# Patient Record
Sex: Male | Born: 1935 | Race: White | Hispanic: No | Marital: Married | Smoking: Former smoker
Health system: Southern US, Community
[De-identification: ages and names within clinical notes are randomized; demographics above are authoritative.]

## PROBLEM LIST (undated history)

## (undated) DIAGNOSIS — E039 Hypothyroidism, unspecified: Secondary | ICD-10-CM

## (undated) DIAGNOSIS — E785 Hyperlipidemia, unspecified: Secondary | ICD-10-CM

## (undated) DIAGNOSIS — G4733 Obstructive sleep apnea (adult) (pediatric): Secondary | ICD-10-CM

## (undated) DIAGNOSIS — F0390 Unspecified dementia without behavioral disturbance: Secondary | ICD-10-CM

## (undated) DIAGNOSIS — I10 Essential (primary) hypertension: Secondary | ICD-10-CM

## (undated) DIAGNOSIS — G40909 Epilepsy, unspecified, not intractable, without status epilepticus: Secondary | ICD-10-CM

## (undated) DIAGNOSIS — I2510 Atherosclerotic heart disease of native coronary artery without angina pectoris: Secondary | ICD-10-CM

## (undated) HISTORY — PX: CHOLECYSTECTOMY: SHX55

## (undated) HISTORY — PX: OTHER SURGICAL HISTORY: SHX169

## (undated) HISTORY — DX: Essential (primary) hypertension: I10

## (undated) HISTORY — DX: Epilepsy, unspecified, not intractable, without status epilepticus: G40.909

## (undated) HISTORY — DX: Obstructive sleep apnea (adult) (pediatric): G47.33

## (undated) HISTORY — PX: POSTERIOR LAMINECTOMY / DECOMPRESSION CERVICAL SPINE: SUR739

## (undated) HISTORY — DX: Atherosclerotic heart disease of native coronary artery without angina pectoris: I25.10

## (undated) HISTORY — DX: Hyperlipidemia, unspecified: E78.5

## (undated) HISTORY — DX: Unspecified dementia, unspecified severity, without behavioral disturbance, psychotic disturbance, mood disturbance, and anxiety: F03.90

## (undated) HISTORY — DX: Hypothyroidism, unspecified: E03.9

---

## 2006-03-27 ENCOUNTER — Ambulatory Visit: Payer: Self-pay | Admitting: Family Medicine

## 2006-03-28 ENCOUNTER — Ambulatory Visit: Payer: Self-pay | Admitting: Family Medicine

## 2006-05-08 ENCOUNTER — Ambulatory Visit: Payer: Self-pay | Admitting: Family Medicine

## 2006-05-08 LAB — CONVERTED CEMR LAB

## 2006-05-09 ENCOUNTER — Encounter: Payer: Self-pay | Admitting: Family Medicine

## 2006-05-09 LAB — CONVERTED CEMR LAB: Creatinine, Ser: 1.1 mg/dL

## 2006-05-16 DIAGNOSIS — E785 Hyperlipidemia, unspecified: Secondary | ICD-10-CM | POA: Insufficient documentation

## 2006-05-31 ENCOUNTER — Ambulatory Visit: Payer: Self-pay | Admitting: Family Medicine

## 2006-05-31 ENCOUNTER — Encounter: Payer: Self-pay | Admitting: Family Medicine

## 2006-05-31 DIAGNOSIS — G4733 Obstructive sleep apnea (adult) (pediatric): Secondary | ICD-10-CM

## 2006-06-01 ENCOUNTER — Encounter: Payer: Self-pay | Admitting: Family Medicine

## 2006-06-01 LAB — CONVERTED CEMR LAB
HDL: 34 mg/dL — ABNORMAL LOW (ref >39)
LDL Cholesterol: 81 mg/dL (ref 0–99)
Total CHOL/HDL Ratio: 4.5
Triglycerides: 191 mg/dL — ABNORMAL HIGH (ref <150)

## 2006-07-10 ENCOUNTER — Ambulatory Visit: Payer: Self-pay | Admitting: Family Medicine

## 2006-07-11 ENCOUNTER — Telehealth: Payer: Self-pay | Admitting: Family Medicine

## 2006-07-24 ENCOUNTER — Ambulatory Visit: Payer: Self-pay | Admitting: Family Medicine

## 2006-07-25 ENCOUNTER — Encounter: Payer: Self-pay | Admitting: Family Medicine

## 2006-07-25 ENCOUNTER — Telehealth: Payer: Self-pay | Admitting: Family Medicine

## 2006-07-25 LAB — CONVERTED CEMR LAB
Albumin: 4.2 g/dL (ref 3.5–5.2)
BUN: 29 mg/dL — ABNORMAL HIGH (ref 6–23)
CO2: 27 meq/L (ref 19–32)
Glucose, Bld: 118 mg/dL — ABNORMAL HIGH (ref 70–99)
Potassium: 4.3 meq/L (ref 3.5–5.3)
Sodium: 137 meq/L (ref 135–145)
Total CK: 140 units/L (ref 7–232)
Total Protein: 7.3 g/dL (ref 6.0–8.3)

## 2006-07-26 ENCOUNTER — Telehealth: Payer: Self-pay | Admitting: Family Medicine

## 2006-08-04 ENCOUNTER — Telehealth: Payer: Self-pay | Admitting: Family Medicine

## 2006-08-10 ENCOUNTER — Encounter: Payer: Self-pay | Admitting: Family Medicine

## 2006-10-04 ENCOUNTER — Ambulatory Visit: Payer: Self-pay | Admitting: Family Medicine

## 2006-10-04 LAB — CONVERTED CEMR LAB: Microalbumin U total vol: 200 mg/L

## 2006-10-10 ENCOUNTER — Telehealth: Payer: Self-pay | Admitting: Family Medicine

## 2006-10-10 ENCOUNTER — Encounter: Payer: Self-pay | Admitting: Family Medicine

## 2006-10-24 ENCOUNTER — Ambulatory Visit: Payer: Self-pay | Admitting: Family Medicine

## 2006-11-09 ENCOUNTER — Encounter: Payer: Self-pay | Admitting: Family Medicine

## 2006-11-13 ENCOUNTER — Telehealth: Payer: Self-pay | Admitting: Family Medicine

## 2006-11-14 ENCOUNTER — Telehealth: Payer: Self-pay | Admitting: Family Medicine

## 2006-11-14 DIAGNOSIS — R9431 Abnormal electrocardiogram [ECG] [EKG]: Secondary | ICD-10-CM

## 2006-11-17 ENCOUNTER — Ambulatory Visit: Payer: Self-pay | Admitting: Cardiology

## 2006-11-28 ENCOUNTER — Ambulatory Visit: Payer: Self-pay

## 2006-11-28 ENCOUNTER — Encounter: Payer: Self-pay | Admitting: Cardiology

## 2006-11-28 ENCOUNTER — Encounter: Payer: Self-pay | Admitting: Family Medicine

## 2006-12-06 ENCOUNTER — Ambulatory Visit: Payer: Self-pay | Admitting: Cardiology

## 2007-01-30 ENCOUNTER — Encounter: Payer: Self-pay | Admitting: Family Medicine

## 2007-02-02 ENCOUNTER — Encounter: Payer: Self-pay | Admitting: Family Medicine

## 2007-02-02 LAB — CONVERTED CEMR LAB
HDL: 33 mg/dL — ABNORMAL LOW (ref >39)
LDL Cholesterol: 84 mg/dL (ref 0–99)
Total CHOL/HDL Ratio: 4.8
VLDL: 41 mg/dL — ABNORMAL HIGH (ref 0–40)

## 2007-02-28 ENCOUNTER — Ambulatory Visit: Payer: Self-pay | Admitting: Cardiology

## 2007-02-28 ENCOUNTER — Encounter: Admission: RE | Admit: 2007-02-28 | Discharge: 2007-02-28 | Payer: Self-pay | Admitting: Cardiology

## 2007-03-06 ENCOUNTER — Ambulatory Visit: Payer: Self-pay | Admitting: Cardiovascular Disease

## 2007-03-06 ENCOUNTER — Inpatient Hospital Stay (HOSPITAL_BASED_OUTPATIENT_CLINIC_OR_DEPARTMENT_OTHER): Admission: RE | Admit: 2007-03-06 | Discharge: 2007-03-06 | Payer: Self-pay | Admitting: Cardiovascular Disease

## 2007-03-06 ENCOUNTER — Ambulatory Visit (HOSPITAL_COMMUNITY): Admission: RE | Admit: 2007-03-06 | Discharge: 2007-03-07 | Payer: Self-pay | Admitting: Cardiology

## 2007-03-13 ENCOUNTER — Ambulatory Visit: Payer: Self-pay | Admitting: Cardiology

## 2007-03-13 ENCOUNTER — Ambulatory Visit: Payer: Self-pay

## 2007-03-13 ENCOUNTER — Encounter: Payer: Self-pay | Admitting: Family Medicine

## 2007-03-14 ENCOUNTER — Ambulatory Visit: Payer: Self-pay | Admitting: Cardiovascular Disease

## 2007-03-14 ENCOUNTER — Ambulatory Visit: Payer: Self-pay | Admitting: Vascular Surgery

## 2007-03-14 ENCOUNTER — Ambulatory Visit (HOSPITAL_COMMUNITY): Admission: RE | Admit: 2007-03-14 | Discharge: 2007-03-14 | Payer: Self-pay | Admitting: Cardiovascular Disease

## 2007-03-16 ENCOUNTER — Ambulatory Visit: Payer: Self-pay | Admitting: Family Medicine

## 2007-03-16 LAB — CONVERTED CEMR LAB: Hgb A1c MFr Bld: 6.4 %

## 2007-03-20 ENCOUNTER — Encounter: Payer: Self-pay | Admitting: Family Medicine

## 2007-03-21 ENCOUNTER — Ambulatory Visit: Payer: Self-pay | Admitting: Cardiology

## 2007-04-12 ENCOUNTER — Telehealth: Payer: Self-pay | Admitting: Family Medicine

## 2007-05-15 ENCOUNTER — Ambulatory Visit: Payer: Self-pay | Admitting: Family Medicine

## 2007-05-15 DIAGNOSIS — F528 Other sexual dysfunction not due to a substance or known physiological condition: Secondary | ICD-10-CM

## 2007-05-15 DIAGNOSIS — F329 Major depressive disorder, single episode, unspecified: Secondary | ICD-10-CM

## 2007-05-16 ENCOUNTER — Encounter: Payer: Self-pay | Admitting: Family Medicine

## 2007-06-20 ENCOUNTER — Telehealth: Payer: Self-pay | Admitting: Family Medicine

## 2007-07-20 ENCOUNTER — Ambulatory Visit (HOSPITAL_COMMUNITY): Payer: Self-pay | Admitting: Psychiatry

## 2007-07-26 ENCOUNTER — Telehealth: Payer: Self-pay | Admitting: Family Medicine

## 2007-08-20 ENCOUNTER — Ambulatory Visit (HOSPITAL_COMMUNITY): Payer: Self-pay | Admitting: Psychiatry

## 2007-09-10 ENCOUNTER — Ambulatory Visit: Payer: Self-pay | Admitting: Family Medicine

## 2007-09-11 ENCOUNTER — Encounter: Payer: Self-pay | Admitting: Family Medicine

## 2007-09-12 ENCOUNTER — Telehealth (INDEPENDENT_AMBULATORY_CARE_PROVIDER_SITE_OTHER): Payer: Self-pay | Admitting: *Deleted

## 2007-09-12 ENCOUNTER — Encounter: Payer: Self-pay | Admitting: Family Medicine

## 2007-09-12 LAB — CONVERTED CEMR LAB
ALT: 18 units/L (ref 0–53)
Albumin: 4.3 g/dL (ref 3.5–5.2)
CO2: 22 meq/L (ref 19–32)
Cholesterol: 125 mg/dL (ref 0–200)
Glucose, Bld: 131 mg/dL — ABNORMAL HIGH (ref 70–99)
Hemoglobin: 14.5 g/dL (ref 13.0–17.0)
Hgb A1c MFr Bld: 6.3 % — ABNORMAL HIGH (ref 4.6–6.1)
LDL Cholesterol: 61 mg/dL (ref 0–99)
Platelets: 206 10*3/uL (ref 150–400)
Potassium: 4.8 meq/L (ref 3.5–5.3)
Sodium: 143 meq/L (ref 135–145)
TSH: 5.026 microintl units/mL (ref 0.350–5.50)
Total Protein: 7.2 g/dL (ref 6.0–8.3)
Triglycerides: 154 mg/dL — ABNORMAL HIGH (ref <150)
VLDL: 31 mg/dL (ref 0–40)

## 2007-09-13 LAB — CONVERTED CEMR LAB: Free T4: 1.4 ng/dL (ref 0.89–1.80)

## 2007-09-18 ENCOUNTER — Telehealth: Payer: Self-pay | Admitting: Family Medicine

## 2007-09-19 ENCOUNTER — Encounter: Payer: Self-pay | Admitting: Family Medicine

## 2007-09-19 ENCOUNTER — Ambulatory Visit: Payer: Self-pay | Admitting: Cardiology

## 2007-09-20 ENCOUNTER — Encounter: Payer: Self-pay | Admitting: Family Medicine

## 2007-10-01 ENCOUNTER — Ambulatory Visit (HOSPITAL_COMMUNITY): Payer: Self-pay | Admitting: Psychiatry

## 2007-11-24 IMAGING — CR DG CHEST 2V
2 series · 2 of 2 positions shown · non-contrast
Comparison: None.

CLINICAL DATA: Ex-smoker with abnormal stress test. 
 CHEST - 2 VIEW:

[view not recorded (1 of 2)]
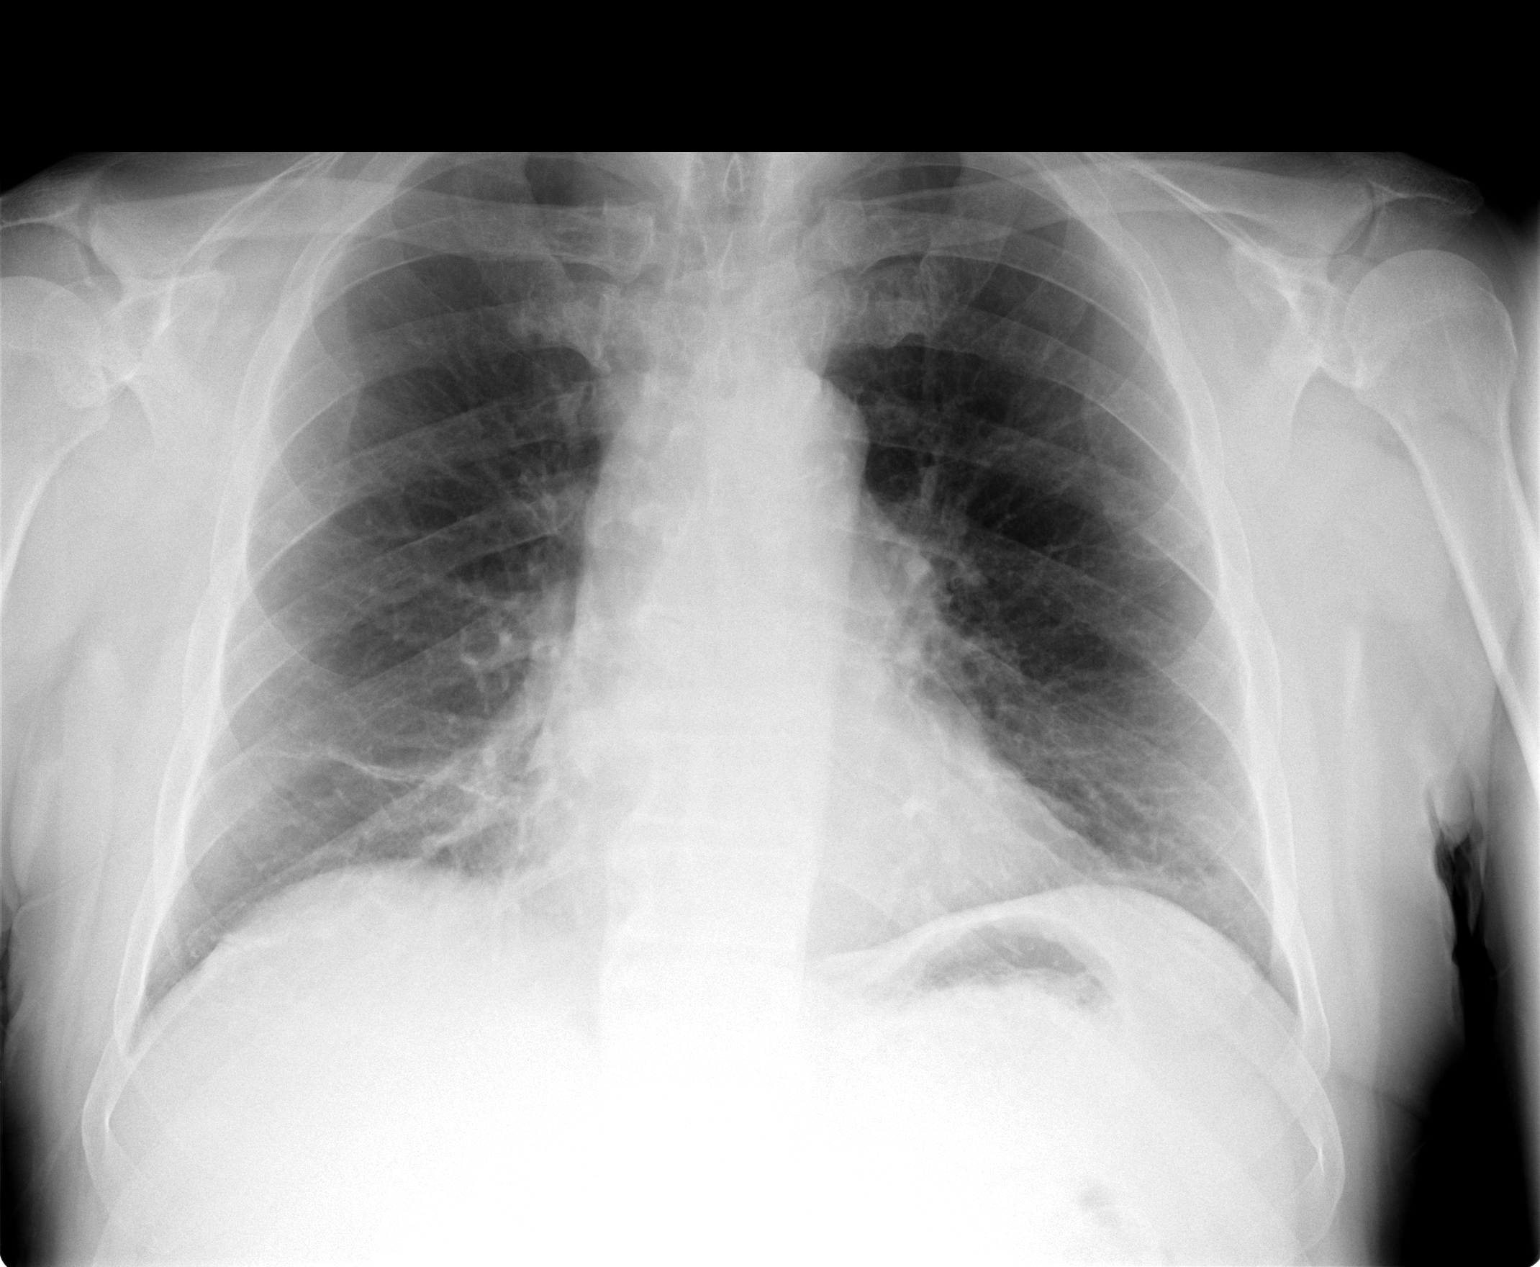

[view not recorded (2 of 2)]
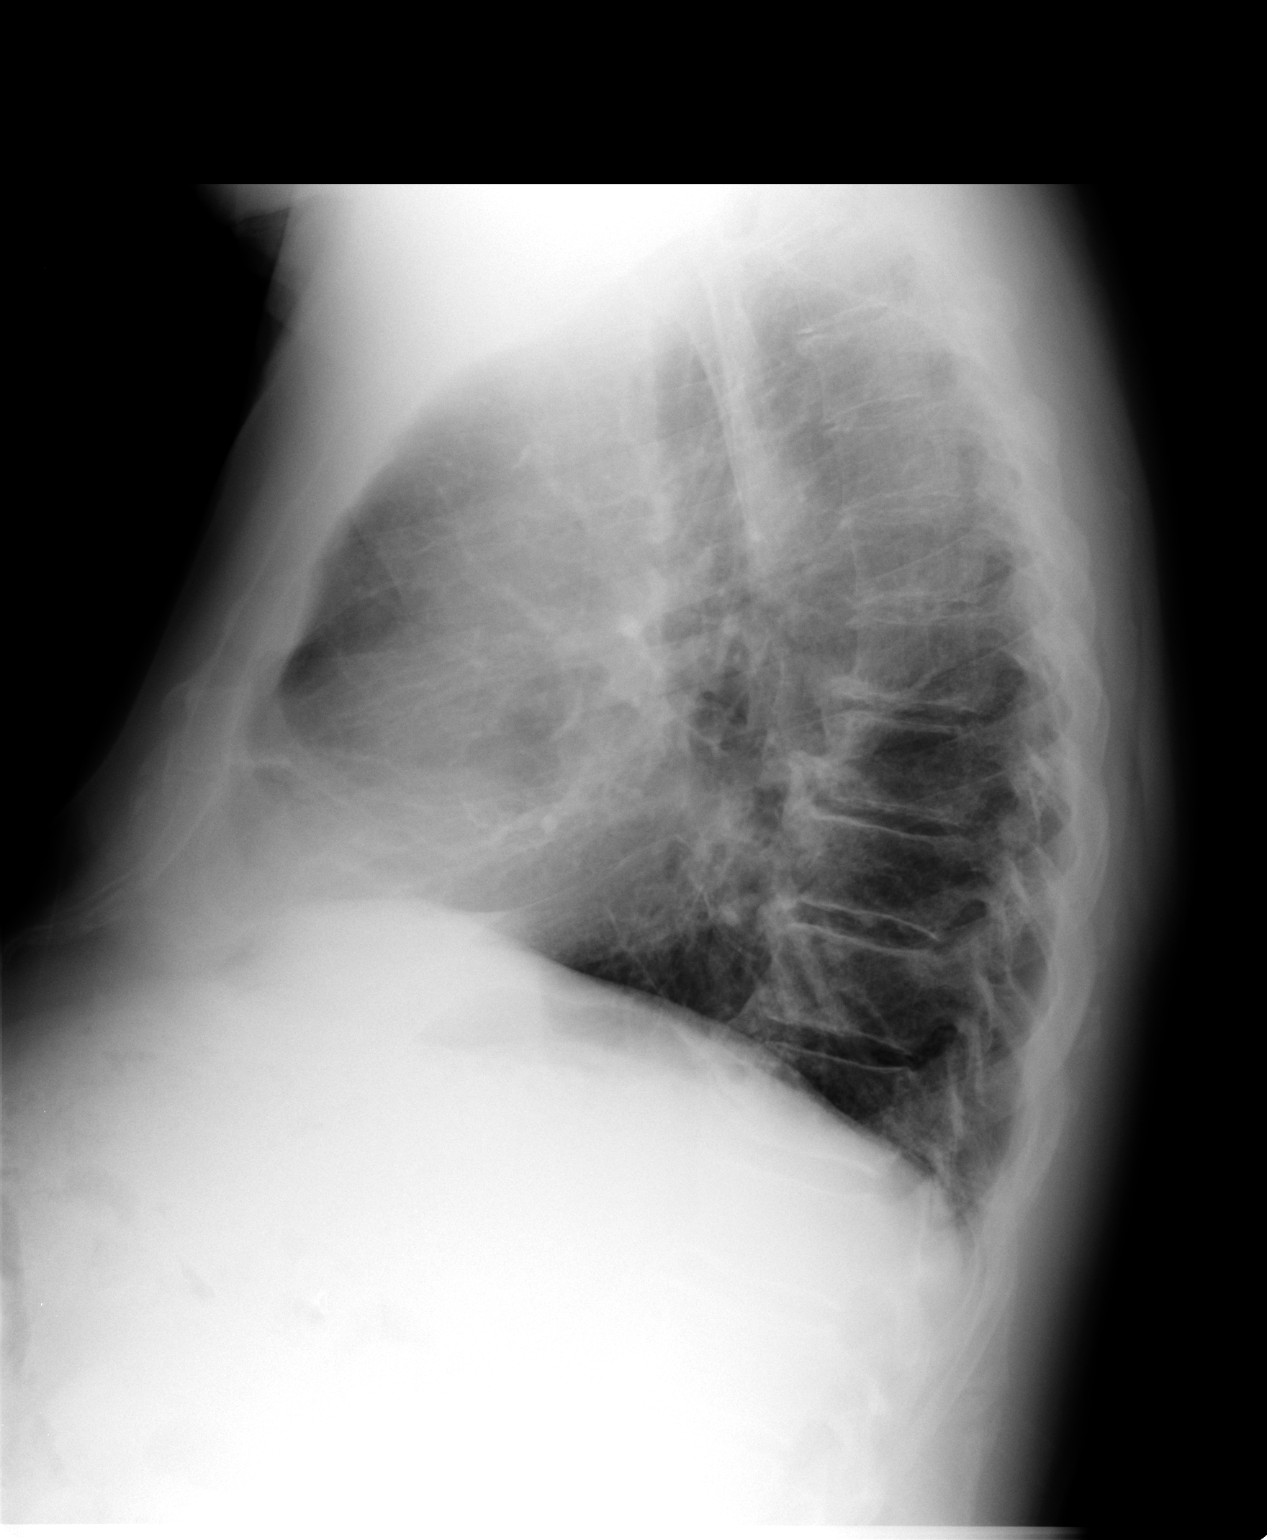

[2 of 2 positions shown; findings below may reference images not displayed]

FINDINGS: Trachea is midline.  Heart size at the upper limits of normal.  Bibasilar scarring or atelectasis.  No pleural fluid.
IMPRESSION: No acute findings.

## 2007-11-26 ENCOUNTER — Ambulatory Visit (HOSPITAL_COMMUNITY): Payer: Self-pay | Admitting: Psychiatry

## 2008-01-07 ENCOUNTER — Ambulatory Visit (HOSPITAL_COMMUNITY): Payer: Self-pay | Admitting: Psychiatry

## 2008-01-31 ENCOUNTER — Ambulatory Visit: Payer: Self-pay | Admitting: Family Medicine

## 2008-02-25 ENCOUNTER — Ambulatory Visit: Payer: Self-pay | Admitting: Family Medicine

## 2008-02-25 DIAGNOSIS — R42 Dizziness and giddiness: Secondary | ICD-10-CM

## 2008-02-26 ENCOUNTER — Encounter: Payer: Self-pay | Admitting: Family Medicine

## 2008-02-28 ENCOUNTER — Encounter: Payer: Self-pay | Admitting: Family Medicine

## 2008-02-28 ENCOUNTER — Telehealth (INDEPENDENT_AMBULATORY_CARE_PROVIDER_SITE_OTHER): Payer: Self-pay | Admitting: *Deleted

## 2008-02-28 LAB — CONVERTED CEMR LAB
ALT: 25 units/L (ref 0–53)
AST: 18 units/L (ref 0–37)
Albumin: 4 g/dL (ref 3.5–5.2)
Alkaline Phosphatase: 40 units/L (ref 39–117)
Basophils Absolute: 0.1 10*3/uL (ref 0.0–0.1)
Basophils Relative: 1 % (ref 0–1)
Calcium: 9 mg/dL (ref 8.4–10.5)
Chloride: 107 meq/L (ref 96–112)
Eosinophils Absolute: 0.3 10*3/uL (ref 0.0–0.7)
HDL: 39 mg/dL — ABNORMAL LOW (ref >39)
LDL Cholesterol: 80 mg/dL (ref 0–99)
MCHC: 32.5 g/dL (ref 30.0–36.0)
MCV: 90.4 fL (ref 78.0–100.0)
Monocytes Relative: 10 % (ref 3–12)
Neutro Abs: 4.7 10*3/uL (ref 1.7–7.7)
Neutrophils Relative %: 61 % (ref 43–77)
PSA: 0.32 ng/mL (ref 0.10–4.00)
Platelets: 184 10*3/uL (ref 150–400)
Potassium: 4.3 meq/L (ref 3.5–5.3)
RBC: 4.39 M/uL (ref 4.22–5.81)
Sodium: 139 meq/L (ref 135–145)
TSH: 6.301 microintl units/mL — ABNORMAL HIGH (ref 0.350–4.50)
Total Protein: 6.7 g/dL (ref 6.0–8.3)

## 2008-02-29 LAB — CONVERTED CEMR LAB
Free T4: 1.05 ng/dL (ref 0.89–1.80)
T3, Free: 2.9 pg/mL (ref 2.3–4.2)

## 2008-03-03 ENCOUNTER — Encounter: Payer: Self-pay | Admitting: Family Medicine

## 2008-03-03 ENCOUNTER — Encounter: Admission: RE | Admit: 2008-03-03 | Discharge: 2008-03-18 | Payer: Self-pay | Admitting: Family Medicine

## 2008-03-07 ENCOUNTER — Ambulatory Visit (HOSPITAL_COMMUNITY): Payer: Self-pay | Admitting: Psychiatry

## 2008-03-14 ENCOUNTER — Ambulatory Visit (HOSPITAL_COMMUNITY): Payer: Self-pay | Admitting: Psychiatry

## 2008-03-18 ENCOUNTER — Encounter: Payer: Self-pay | Admitting: Family Medicine

## 2008-03-27 ENCOUNTER — Encounter: Payer: Self-pay | Admitting: Family Medicine

## 2008-04-04 ENCOUNTER — Ambulatory Visit: Payer: Self-pay | Admitting: Family Medicine

## 2008-04-04 DIAGNOSIS — I498 Other specified cardiac arrhythmias: Secondary | ICD-10-CM

## 2008-04-21 ENCOUNTER — Ambulatory Visit: Payer: Self-pay | Admitting: Family Medicine

## 2008-04-22 ENCOUNTER — Telehealth (INDEPENDENT_AMBULATORY_CARE_PROVIDER_SITE_OTHER): Payer: Self-pay | Admitting: *Deleted

## 2008-04-22 ENCOUNTER — Encounter: Payer: Self-pay | Admitting: Family Medicine

## 2008-04-23 LAB — CONVERTED CEMR LAB
ALT: 20 units/L (ref 0–53)
Albumin: 4 g/dL (ref 3.5–5.2)
Alkaline Phosphatase: 53 units/L (ref 39–117)
Basophils Absolute: 0 10*3/uL (ref 0.0–0.1)
CO2: 20 meq/L (ref 19–32)
Eosinophils Relative: 1 % (ref 0–5)
HCT: 39 % (ref 39.0–52.0)
Lymphocytes Relative: 16 % (ref 12–46)
Neutrophils Relative %: 72 % (ref 43–77)
Platelets: 229 10*3/uL (ref 150–400)
Potassium: 4.3 meq/L (ref 3.5–5.3)
RDW: 14 % (ref 11.5–15.5)
Sodium: 140 meq/L (ref 135–145)
TSH: 4.812 microintl units/mL — ABNORMAL HIGH (ref 0.350–4.50)
Total Bilirubin: 0.4 mg/dL (ref 0.3–1.2)
Total Protein: 7.1 g/dL (ref 6.0–8.3)
WBC: 12 10*3/uL — ABNORMAL HIGH (ref 4.0–10.5)

## 2008-04-25 ENCOUNTER — Encounter: Admission: RE | Admit: 2008-04-25 | Discharge: 2008-04-25 | Payer: Self-pay | Admitting: Family Medicine

## 2008-05-09 ENCOUNTER — Ambulatory Visit (HOSPITAL_COMMUNITY): Payer: Self-pay | Admitting: Psychiatry

## 2008-05-29 ENCOUNTER — Encounter: Payer: Self-pay | Admitting: Family Medicine

## 2008-06-13 ENCOUNTER — Encounter: Payer: Self-pay | Admitting: Family Medicine

## 2008-06-14 ENCOUNTER — Encounter: Payer: Self-pay | Admitting: Family Medicine

## 2008-06-19 ENCOUNTER — Ambulatory Visit: Payer: Self-pay | Admitting: Family Medicine

## 2008-06-19 DIAGNOSIS — G309 Alzheimer's disease, unspecified: Secondary | ICD-10-CM

## 2008-06-19 DIAGNOSIS — F0280 Dementia in other diseases classified elsewhere without behavioral disturbance: Secondary | ICD-10-CM | POA: Insufficient documentation

## 2008-06-20 ENCOUNTER — Telehealth: Payer: Self-pay | Admitting: Family Medicine

## 2008-06-26 ENCOUNTER — Encounter: Payer: Self-pay | Admitting: Family Medicine

## 2008-07-11 ENCOUNTER — Telehealth: Payer: Self-pay | Admitting: Family Medicine

## 2008-07-18 ENCOUNTER — Ambulatory Visit: Payer: Self-pay | Admitting: Family Medicine

## 2008-07-18 DIAGNOSIS — M79609 Pain in unspecified limb: Secondary | ICD-10-CM

## 2008-07-19 ENCOUNTER — Telehealth: Payer: Self-pay | Admitting: Family Medicine

## 2008-07-19 ENCOUNTER — Encounter: Payer: Self-pay | Admitting: Family Medicine

## 2008-07-19 ENCOUNTER — Ambulatory Visit (HOSPITAL_COMMUNITY): Admission: RE | Admit: 2008-07-19 | Discharge: 2008-07-19 | Payer: Self-pay | Admitting: Family Medicine

## 2008-07-19 ENCOUNTER — Ambulatory Visit: Payer: Self-pay | Admitting: Vascular Surgery

## 2008-07-22 ENCOUNTER — Telehealth: Payer: Self-pay | Admitting: Family Medicine

## 2008-07-24 ENCOUNTER — Ambulatory Visit: Payer: Self-pay | Admitting: Family Medicine

## 2008-07-24 DIAGNOSIS — M7120 Synovial cyst of popliteal space [Baker], unspecified knee: Secondary | ICD-10-CM | POA: Insufficient documentation

## 2008-08-11 ENCOUNTER — Ambulatory Visit (HOSPITAL_COMMUNITY): Payer: Self-pay | Admitting: Psychiatry

## 2008-08-15 ENCOUNTER — Encounter: Payer: Self-pay | Admitting: Family Medicine

## 2008-10-01 ENCOUNTER — Ambulatory Visit: Payer: Self-pay | Admitting: Family Medicine

## 2008-10-01 DIAGNOSIS — R569 Unspecified convulsions: Secondary | ICD-10-CM

## 2008-10-02 LAB — CONVERTED CEMR LAB
Albumin: 4.1 g/dL (ref 3.5–5.2)
Bilirubin, Direct: <0.1 mg/dL (ref 0.0–0.3)
CO2: 22 meq/L (ref 19–32)
Calcium: 9.2 mg/dL (ref 8.4–10.5)
Glucose, Bld: 110 mg/dL — ABNORMAL HIGH (ref 70–99)
Potassium: 4.5 meq/L (ref 3.5–5.3)
Sodium: 136 meq/L (ref 135–145)
Total Protein: 7.1 g/dL (ref 6.0–8.3)

## 2008-10-06 ENCOUNTER — Encounter: Admission: RE | Admit: 2008-10-06 | Discharge: 2008-10-06 | Payer: Self-pay | Admitting: Family Medicine

## 2008-11-10 ENCOUNTER — Encounter: Payer: Self-pay | Admitting: Family Medicine

## 2008-11-11 ENCOUNTER — Ambulatory Visit (HOSPITAL_COMMUNITY): Payer: Self-pay | Admitting: Psychiatry

## 2008-11-19 ENCOUNTER — Ambulatory Visit: Payer: Self-pay | Admitting: Occupational Medicine

## 2008-11-19 DIAGNOSIS — M25569 Pain in unspecified knee: Secondary | ICD-10-CM | POA: Insufficient documentation

## 2008-11-20 ENCOUNTER — Encounter: Payer: Self-pay | Admitting: Family Medicine

## 2008-12-09 ENCOUNTER — Encounter: Payer: Self-pay | Admitting: Family Medicine

## 2008-12-11 ENCOUNTER — Telehealth (INDEPENDENT_AMBULATORY_CARE_PROVIDER_SITE_OTHER): Payer: Self-pay | Admitting: Internal Medicine

## 2008-12-11 ENCOUNTER — Ambulatory Visit: Payer: Self-pay | Admitting: Family Medicine

## 2008-12-11 LAB — CONVERTED CEMR LAB
Nitrite: NEGATIVE
Urobilinogen, UA: 1

## 2008-12-12 ENCOUNTER — Encounter: Payer: Self-pay | Admitting: Family Medicine

## 2008-12-15 ENCOUNTER — Encounter: Payer: Self-pay | Admitting: Family Medicine

## 2008-12-16 ENCOUNTER — Emergency Department (HOSPITAL_BASED_OUTPATIENT_CLINIC_OR_DEPARTMENT_OTHER): Admission: EM | Admit: 2008-12-16 | Discharge: 2008-12-16 | Payer: Self-pay | Admitting: Emergency Medicine

## 2008-12-16 ENCOUNTER — Ambulatory Visit: Payer: Self-pay | Admitting: Family Medicine

## 2008-12-16 ENCOUNTER — Telehealth: Payer: Self-pay | Admitting: Family Medicine

## 2008-12-16 DIAGNOSIS — E871 Hypo-osmolality and hyponatremia: Secondary | ICD-10-CM | POA: Insufficient documentation

## 2008-12-17 ENCOUNTER — Encounter: Payer: Self-pay | Admitting: Family Medicine

## 2008-12-17 LAB — CONVERTED CEMR LAB
BUN: 20 mg/dL (ref 6–23)
Basophils Relative: 3 % — ABNORMAL HIGH (ref 0–1)
Eosinophils Absolute: 0.2 10*3/uL (ref 0.0–0.7)
Glucose, Bld: 105 mg/dL — ABNORMAL HIGH (ref 70–99)
MCHC: 35.3 g/dL (ref 30.0–36.0)
MCV: 80.5 fL (ref 78.0–100.0)
Monocytes Relative: 21 % — ABNORMAL HIGH (ref 3–12)
Neutrophils Relative %: 60 % (ref 43–77)
Platelets: 250 10*3/uL (ref 150–400)
Potassium: 5.1 meq/L (ref 3.5–5.3)
RBC: 3.38 M/uL — ABNORMAL LOW (ref 4.22–5.81)

## 2008-12-18 ENCOUNTER — Encounter: Payer: Self-pay | Admitting: Family Medicine

## 2008-12-18 LAB — CONVERTED CEMR LAB
Folate: >20 ng/mL
Free T4: 0.97 ng/dL (ref 0.80–1.80)
Iron: 92 ug/dL (ref 42–165)
T3, Free: 2.3 pg/mL (ref 2.3–4.2)
Vitamin B-12: 893 pg/mL (ref 211–911)

## 2008-12-19 ENCOUNTER — Encounter: Payer: Self-pay | Admitting: Family Medicine

## 2008-12-19 LAB — CONVERTED CEMR LAB
CO2: 18 meq/L — ABNORMAL LOW (ref 19–32)
Calcium: 8.5 mg/dL (ref 8.4–10.5)
Sodium: 134 meq/L — ABNORMAL LOW (ref 135–145)

## 2008-12-22 ENCOUNTER — Encounter: Payer: Self-pay | Admitting: Family Medicine

## 2008-12-26 ENCOUNTER — Encounter: Payer: Self-pay | Admitting: Family Medicine

## 2008-12-30 ENCOUNTER — Encounter: Payer: Self-pay | Admitting: Family Medicine

## 2009-01-01 ENCOUNTER — Encounter: Payer: Self-pay | Admitting: Family Medicine

## 2009-01-09 ENCOUNTER — Ambulatory Visit: Payer: Self-pay | Admitting: Family Medicine

## 2009-01-09 DIAGNOSIS — M6281 Muscle weakness (generalized): Secondary | ICD-10-CM | POA: Insufficient documentation

## 2009-01-10 ENCOUNTER — Ambulatory Visit: Payer: Self-pay | Admitting: Family Medicine

## 2009-01-10 DIAGNOSIS — R319 Hematuria, unspecified: Secondary | ICD-10-CM

## 2009-01-10 DIAGNOSIS — E119 Type 2 diabetes mellitus without complications: Secondary | ICD-10-CM

## 2009-01-10 LAB — CONVERTED CEMR LAB
Bilirubin Urine: NEGATIVE
Glucose, Urine, Semiquant: NEGATIVE
Nitrite: POSITIVE

## 2009-01-11 ENCOUNTER — Telehealth: Payer: Self-pay | Admitting: Family Medicine

## 2009-01-12 LAB — CONVERTED CEMR LAB
Sex Hormone Binding: 34 nmol/L (ref 13–71)
Testosterone Free: 24.2 pg/mL — ABNORMAL LOW (ref 47.0–244.0)

## 2009-01-15 ENCOUNTER — Ambulatory Visit: Payer: Self-pay | Admitting: Family Medicine

## 2009-01-15 DIAGNOSIS — E291 Testicular hypofunction: Secondary | ICD-10-CM

## 2009-01-23 ENCOUNTER — Encounter: Payer: Self-pay | Admitting: Family Medicine

## 2009-01-27 ENCOUNTER — Telehealth: Payer: Self-pay | Admitting: Family Medicine

## 2009-01-28 ENCOUNTER — Encounter (INDEPENDENT_AMBULATORY_CARE_PROVIDER_SITE_OTHER): Payer: Self-pay | Admitting: *Deleted

## 2009-01-29 ENCOUNTER — Ambulatory Visit: Payer: Self-pay | Admitting: Family Medicine

## 2009-02-10 ENCOUNTER — Ambulatory Visit (HOSPITAL_COMMUNITY): Payer: Self-pay | Admitting: Psychiatry

## 2009-02-12 ENCOUNTER — Ambulatory Visit: Payer: Self-pay | Admitting: Family Medicine

## 2009-02-17 ENCOUNTER — Encounter: Payer: Self-pay | Admitting: Family Medicine

## 2009-02-26 ENCOUNTER — Ambulatory Visit: Payer: Self-pay | Admitting: Family Medicine

## 2009-02-27 LAB — CONVERTED CEMR LAB
ALT: 19 units/L (ref 0–53)
AST: 17 units/L (ref 0–37)
Bilirubin, Direct: 0.1 mg/dL (ref 0.0–0.3)
Indirect Bilirubin: 0.4 mg/dL (ref 0.0–0.9)

## 2009-03-01 ENCOUNTER — Ambulatory Visit: Payer: Self-pay | Admitting: Family Medicine

## 2009-03-01 DIAGNOSIS — I10 Essential (primary) hypertension: Secondary | ICD-10-CM | POA: Insufficient documentation

## 2009-03-01 DIAGNOSIS — E039 Hypothyroidism, unspecified: Secondary | ICD-10-CM

## 2009-03-01 LAB — CONVERTED CEMR LAB
BUN: 17 mg/dL (ref 6–23)
Chloride: 102 meq/L (ref 96–112)
Creatinine, Ser: 0.97 mg/dL (ref 0.40–1.50)
Eosinophils Absolute: 0 10*3/uL (ref 0.0–0.7)
Eosinophils Relative: 0 % (ref 0–5)
Glucose, Bld: 131 mg/dL — ABNORMAL HIGH (ref 70–99)
HCT: 44.3 % (ref 39.0–52.0)
Hemoglobin: 14.4 g/dL (ref 13.0–17.0)
Lymphs Abs: 0.9 10*3/uL (ref 0.7–4.0)
MCV: 85.9 fL (ref 78.0–100.0)
Monocytes Absolute: 1.5 10*3/uL — ABNORMAL HIGH (ref 0.1–1.0)
Platelets: 297 10*3/uL (ref 150–400)
RDW: 14.2 % (ref 11.5–15.5)
WBC: 16.6 10*3/uL — ABNORMAL HIGH (ref 4.0–10.5)

## 2009-03-07 DIAGNOSIS — I2510 Atherosclerotic heart disease of native coronary artery without angina pectoris: Secondary | ICD-10-CM | POA: Insufficient documentation

## 2009-03-18 ENCOUNTER — Ambulatory Visit: Payer: Self-pay | Admitting: Cardiology

## 2009-03-18 ENCOUNTER — Encounter: Payer: Self-pay | Admitting: Cardiology

## 2009-03-19 ENCOUNTER — Encounter: Payer: Self-pay | Admitting: Cardiology

## 2009-04-06 ENCOUNTER — Ambulatory Visit: Payer: Self-pay | Admitting: Family Medicine

## 2009-04-07 ENCOUNTER — Encounter: Payer: Self-pay | Admitting: Family Medicine

## 2009-04-07 LAB — CONVERTED CEMR LAB
ALT: 14 units/L (ref 0–53)
AST: 15 units/L (ref 0–37)
Alkaline Phosphatase: 55 units/L (ref 39–117)
BUN: 18 mg/dL (ref 6–23)
Basophils Absolute: 0.1 10*3/uL (ref 0.0–0.1)
Basophils Relative: 1 % (ref 0–1)
Calcium: 9.1 mg/dL (ref 8.4–10.5)
Creatinine, Ser: 1.03 mg/dL (ref 0.40–1.50)
Eosinophils Absolute: 0.1 10*3/uL (ref 0.0–0.7)
MCHC: 31.6 g/dL (ref 30.0–36.0)
MCV: 84.5 fL (ref 78.0–100.0)
Monocytes Relative: 9 % (ref 3–12)
Neutro Abs: 8.7 10*3/uL — ABNORMAL HIGH (ref 1.7–7.7)
Neutrophils Relative %: 77 % (ref 43–77)
Platelets: 230 10*3/uL (ref 150–400)
RBC: 5.1 M/uL (ref 4.22–5.81)
RDW: 15.4 % (ref 11.5–15.5)
Sed Rate: 12 mm/hr (ref 0–16)
Total Bilirubin: 0.4 mg/dL (ref 0.3–1.2)

## 2009-04-09 ENCOUNTER — Encounter: Payer: Self-pay | Admitting: Family Medicine

## 2009-04-20 ENCOUNTER — Telehealth: Payer: Self-pay | Admitting: Family Medicine

## 2009-04-29 ENCOUNTER — Telehealth: Payer: Self-pay | Admitting: Family Medicine

## 2009-05-11 ENCOUNTER — Ambulatory Visit (HOSPITAL_COMMUNITY): Payer: Self-pay | Admitting: Psychiatry

## 2009-05-26 ENCOUNTER — Ambulatory Visit: Payer: Self-pay | Admitting: Family Medicine

## 2009-05-26 LAB — CONVERTED CEMR LAB: Hgb A1c MFr Bld: 6.7 %

## 2009-06-03 ENCOUNTER — Encounter: Payer: Self-pay | Admitting: Family Medicine

## 2009-06-16 ENCOUNTER — Encounter: Payer: Self-pay | Admitting: Family Medicine

## 2009-06-30 ENCOUNTER — Telehealth: Payer: Self-pay | Admitting: Family Medicine

## 2009-08-10 ENCOUNTER — Ambulatory Visit (HOSPITAL_COMMUNITY): Payer: Self-pay | Admitting: Psychiatry

## 2009-08-28 ENCOUNTER — Encounter: Payer: Self-pay | Admitting: Family Medicine

## 2009-08-31 ENCOUNTER — Encounter: Payer: Self-pay | Admitting: Family Medicine

## 2009-11-05 ENCOUNTER — Ambulatory Visit: Payer: Self-pay | Admitting: Family Medicine

## 2009-11-25 ENCOUNTER — Telehealth: Payer: Self-pay | Admitting: Cardiology

## 2009-12-02 ENCOUNTER — Ambulatory Visit: Payer: Self-pay | Admitting: Cardiology

## 2009-12-02 ENCOUNTER — Encounter: Payer: Self-pay | Admitting: Cardiology

## 2009-12-02 DIAGNOSIS — R5381 Other malaise: Secondary | ICD-10-CM

## 2009-12-02 DIAGNOSIS — R5383 Other fatigue: Secondary | ICD-10-CM

## 2009-12-07 LAB — CONVERTED CEMR LAB
CO2: 22 meq/L (ref 19–32)
Calcium: 10.2 mg/dL (ref 8.4–10.5)
Chloride: 104 meq/L (ref 96–112)
Creatinine, Ser: 0.94 mg/dL (ref 0.40–1.50)
Glucose, Bld: 148 mg/dL — ABNORMAL HIGH (ref 70–99)
HCT: 40.1 % (ref 39.0–52.0)
MCV: 86.4 fL (ref 78.0–100.0)
Platelets: 195 10*3/uL (ref 150–400)
RBC: 4.64 M/uL (ref 4.22–5.81)
Sodium: 139 meq/L (ref 135–145)
WBC: 7.3 10*3/uL (ref 4.0–10.5)

## 2009-12-08 ENCOUNTER — Encounter: Payer: Self-pay | Admitting: Family Medicine

## 2009-12-10 ENCOUNTER — Telehealth (INDEPENDENT_AMBULATORY_CARE_PROVIDER_SITE_OTHER): Payer: Self-pay | Admitting: *Deleted

## 2009-12-14 ENCOUNTER — Ambulatory Visit: Payer: Self-pay

## 2009-12-14 ENCOUNTER — Ambulatory Visit: Payer: Self-pay | Admitting: Internal Medicine

## 2009-12-14 ENCOUNTER — Encounter (HOSPITAL_COMMUNITY): Admission: RE | Admit: 2009-12-14 | Discharge: 2010-02-05 | Payer: Self-pay | Admitting: Cardiology

## 2010-01-20 ENCOUNTER — Encounter: Payer: Self-pay | Admitting: Family Medicine

## 2010-01-29 ENCOUNTER — Encounter: Payer: Self-pay | Admitting: Family Medicine

## 2010-02-10 ENCOUNTER — Encounter: Payer: Self-pay | Admitting: Family Medicine

## 2010-02-17 ENCOUNTER — Ambulatory Visit: Payer: Self-pay | Admitting: Cardiology

## 2010-02-17 ENCOUNTER — Encounter: Payer: Self-pay | Admitting: Cardiology

## 2010-02-23 ENCOUNTER — Encounter: Payer: Self-pay | Admitting: Family Medicine

## 2010-03-16 ENCOUNTER — Encounter: Payer: Self-pay | Admitting: Family Medicine

## 2010-03-29 ENCOUNTER — Encounter: Payer: Self-pay | Admitting: Family Medicine

## 2010-04-06 ENCOUNTER — Ambulatory Visit: Payer: Self-pay | Admitting: Family Medicine

## 2010-04-06 DIAGNOSIS — N138 Other obstructive and reflux uropathy: Secondary | ICD-10-CM | POA: Insufficient documentation

## 2010-04-06 DIAGNOSIS — N401 Enlarged prostate with lower urinary tract symptoms: Secondary | ICD-10-CM

## 2010-04-07 ENCOUNTER — Ambulatory Visit: Payer: Self-pay | Admitting: Family Medicine

## 2010-04-07 LAB — CONVERTED CEMR LAB
Blood in Urine, dipstick: NEGATIVE
Creatinine,U: 100 mg/dL
Nitrite: NEGATIVE
PSA: 0.25 ng/mL (ref 0.10–4.00)
TSH: 3.799 microintl units/mL (ref 0.350–4.500)
Urobilinogen, UA: 0.2
WBC Urine, dipstick: NEGATIVE
pH: 6

## 2010-04-08 ENCOUNTER — Encounter: Payer: Self-pay | Admitting: Family Medicine

## 2010-04-16 ENCOUNTER — Encounter: Payer: Self-pay | Admitting: Family Medicine

## 2010-04-17 LAB — CONVERTED CEMR LAB
LDL Cholesterol: 115 mg/dL — ABNORMAL HIGH (ref 0–99)
VLDL: 65 mg/dL — ABNORMAL HIGH (ref 0–40)

## 2010-05-14 ENCOUNTER — Ambulatory Visit: Payer: Self-pay | Admitting: Family Medicine

## 2010-05-14 DIAGNOSIS — M5430 Sciatica, unspecified side: Secondary | ICD-10-CM

## 2010-05-20 ENCOUNTER — Telehealth: Payer: Self-pay | Admitting: Family Medicine

## 2010-05-25 ENCOUNTER — Ambulatory Visit: Payer: Self-pay | Admitting: Family Medicine

## 2010-05-25 DIAGNOSIS — M542 Cervicalgia: Secondary | ICD-10-CM | POA: Insufficient documentation

## 2010-05-25 LAB — CONVERTED CEMR LAB
Glucose, Urine, Semiquant: NEGATIVE
Nitrite: NEGATIVE
Urobilinogen, UA: 0.2
WBC Urine, dipstick: NEGATIVE

## 2010-05-26 LAB — CONVERTED CEMR LAB
AST: 18 units/L (ref 0–37)
Albumin: 4.4 g/dL (ref 3.5–5.2)
BUN: 13 mg/dL (ref 6–23)
Basophils Relative: 1 % (ref 0–1)
CO2: 23 meq/L (ref 19–32)
Calcium: 9.6 mg/dL (ref 8.4–10.5)
Chloride: 101 meq/L (ref 96–112)
Lymphocytes Relative: 32 % (ref 12–46)
Lymphs Abs: 3.1 10*3/uL (ref 0.7–4.0)
MCHC: 32.3 g/dL (ref 30.0–36.0)
Monocytes Relative: 12 % (ref 3–12)
Neutro Abs: 4.8 10*3/uL (ref 1.7–7.7)
Neutrophils Relative %: 51 % (ref 43–77)
Potassium: 4.6 meq/L (ref 3.5–5.3)
RBC: 4.84 M/uL (ref 4.22–5.81)
WBC: 9.4 10*3/uL (ref 4.0–10.5)

## 2010-06-15 ENCOUNTER — Encounter: Payer: Self-pay | Admitting: Family Medicine

## 2010-08-18 ENCOUNTER — Ambulatory Visit
Admission: RE | Admit: 2010-08-18 | Discharge: 2010-08-18 | Payer: Self-pay | Source: Home / Self Care | Attending: Cardiology | Admitting: Cardiology

## 2010-08-18 ENCOUNTER — Encounter: Payer: Self-pay | Admitting: Cardiology

## 2010-08-18 DIAGNOSIS — R079 Chest pain, unspecified: Secondary | ICD-10-CM | POA: Insufficient documentation

## 2010-08-24 ENCOUNTER — Ambulatory Visit (HOSPITAL_COMMUNITY)
Admission: RE | Admit: 2010-08-24 | Discharge: 2010-08-24 | Payer: Self-pay | Source: Home / Self Care | Attending: Psychiatry | Admitting: Psychiatry

## 2010-08-31 ENCOUNTER — Telehealth (INDEPENDENT_AMBULATORY_CARE_PROVIDER_SITE_OTHER): Payer: Self-pay | Admitting: *Deleted

## 2010-08-31 ENCOUNTER — Ambulatory Visit
Admission: RE | Admit: 2010-08-31 | Discharge: 2010-08-31 | Payer: Self-pay | Source: Home / Self Care | Attending: Family Medicine | Admitting: Family Medicine

## 2010-09-01 ENCOUNTER — Encounter: Payer: Self-pay | Admitting: *Deleted

## 2010-09-01 ENCOUNTER — Encounter: Payer: Self-pay | Admitting: Cardiology

## 2010-09-01 ENCOUNTER — Encounter (HOSPITAL_COMMUNITY)
Admission: RE | Admit: 2010-09-01 | Discharge: 2010-09-07 | Payer: Self-pay | Source: Home / Self Care | Attending: Cardiology | Admitting: Cardiology

## 2010-09-01 ENCOUNTER — Ambulatory Visit: Admission: RE | Admit: 2010-09-01 | Discharge: 2010-09-01 | Payer: Self-pay | Source: Home / Self Care

## 2010-09-07 NOTE — Letter (Signed)
Summary: [facility]  [facility]   Imported By: Lanelle Bal 04/14/2010 09:51:25  _____________________________________________________________________  External Attachment:    Type:   Image     Comment:   External Document

## 2010-09-07 NOTE — Letter (Signed)
Summary: [facility]  [facility]   Imported By: Lanelle Bal 02/25/2010 09:44:29  _____________________________________________________________________  External Attachment:    Type:   Image     Comment:   External Document

## 2010-09-07 NOTE — Progress Notes (Signed)
Summary: sob,weak legs  Phone Note Call from Patient Call back at Home Phone [phone]   Caller: Spouse/ Patsy Summary of Call: SOB Initial call taken by: Judie Grieve,  November 25, 2009 10:00 AM  Follow-up for Phone Call        spoke with pt wife, pt has been having SOB with exerction through the house. he has had swelling in his feet and legs, she is unsure if he has that today. he is also having fatique and tires easily. he would like to be seen. follow up appt made Deliah Goody, RN  November 25, 2009 11:53 AM

## 2010-09-07 NOTE — Assessment & Plan Note (Signed)
Summary: Piriformis syndrome, DM   Vital Signs:  Patient profile:   75 year old male Height:      67 inches Weight:      179 pounds Pulse rate:   74 / minute BP sitting:   121 / 64  (right arm) Cuff size:   regular  Vitals Entered By: Avon Gully CMA, Duncan Dull) (May 14, 2010 3:03 PM) CC: left hip/buttock pain when standing for proonged periods of time x 5 days,goes away with aleve   Primary Care Provider:  Linford Arnold, c  CC:  left hip/buttock pain when standing for proonged periods of time x 5 days and goes away with aleve.  History of Present Illness: Lleft hip/buttock pain when standing for prolonged periods of time x 5 days, goes away with aleve. No radiation of pain. Takes an Aleve and it does help. Started a couple of weeks ago.  Worse standing for long periods.  No other alleviating positions.  No back pain currently .    Diabetes Management History:      The patient is a 75 years old male who comes in for evaluation of DM Type 2.  He has not been enrolled in the "Diabetic Education Program".  He states understanding of dietary principles and is following his diet appropriately.  No sensory loss is reported.  He is checking home blood sugars.  He says that he is not exercising regularly.        Hypoglycemic symptoms are not occurring.  No hyperglycemic symptoms are reported.  Other comments include: Brough in meter today. Sugars in the 110-140s range. .        There are no symptoms to suggest diabetic complications.  Since his last visit, no infections have occurred.  No changes have been made to his treatment plan since last visit.    Habits & Providers  Exercise-Depression-Behavior     Does Patient Exercise: no  Current Medications (verified): 1)  Bayer Aspirin 325 Mg Tabs (Aspirin) .... Take 1 Tablet By Mouth Once A Day 2)  Lovaza 1 Gm Caps (Omega-3-Acid Ethyl Esters) .... 2 Caps Two Times A Day 3)  Metformin Hcl 1000 Mg Tabs (Metformin Hcl) .... Take 1 Tablet By  Mouth Two Times A Day 4)  Calcium 500 Mg Tabs (Calcium Carbonate) .... Take 1 Tablet By Mouth Once A Day 5)  Lisinopril 40 Mg Tabs (Lisinopril) .... Take 1 Tablet By Mouth Once A Day 6)  Glucometer He Has Is One Touch Ultra 2. .... Needs Lancet, Strips To Test 2 X A Day. Dx:250.00 7)  Walker With Financial risk analyst .... Dx: Alzheimers and Seizure D/o, [name] 8)  Zoloft 100 Mg Tabs (Sertraline Hcl) .... Take One Tablet By Mouth At Bedtime 9)  Cinnamon 500 Mg Tabs (Cinnamon) .... 1.000mg  At Lunch 10)  Multivitamins  Tabs (Multiple Vitamin) .... Take 1 Tablet By Mouth Once A Day 11)  Levothroid 50 Mcg Tabs (Levothyroxine Sodium) .... Take 1 Tablet By Mouth Once A Day 12)  Lamictal 100 Mg Tabs (Lamotrigine) .... Take 100mg  in The Am and 200 Mg in The Pm 13)  Flomax 0.4 Mg Caps (Tamsulosin Hcl) .... Take 1 Tablet By Mouth Once A Day 14)  Niaspan 500 Mg Cr-Tabs (Niacin (Antihyperlipidemic)) .... Take 1 Tablet By Mouth Once A Day  Allergies (verified): 1)  ! * Hctz 2)  ! Lipitor 3)  * Some Cholesterol Meds  Comments:  Nurse/Medical Assistant: The patient's medications and allergies were reviewed  with the patient and were updated in the Medication and Allergy Lists. Avon Gully CMA, Duncan Dull) (May 14, 2010 3:04 PM)  Social History: Does Patient Exercise:  no  Physical Exam  General:  Well-developed,well-nourished,in no acute distress; alert,appropriate and cooperative throughout examination Msk:  Lumbar spine with normal ROM.  Notender lumbar spine.  Left buttick tender with deep palpation. Neg straight leg raise. Hip, knee and ankles strength 5/5.  Pain with external roation of the hip.  Rigth hip with NROM.  Extremities:  No LE edema.    Impression & Recommendations:  Problem # 1:  SCIATICA (ICD-724.3) Assessment New I think he has piriformis syndrome.  Given H.O. on exercises to do.  Can use the NSAID two times a day for 4-5 days and then taper off If not getting  better consider PT and consider evaluating his low back futher.  His updated medication list for this problem includes:    Bayer Aspirin 325 Mg Tabs (Aspirin) .Marland Kitchen... Take 1 tablet by mouth once a day  Problem # 2:  DIABETES MELLITUS, TYPE II (ICD-250.00)  sugars look great. Keep up the good work Stay active. Cont current regimen.  His updated medication list for this problem includes:    Bayer Aspirin 325 Mg Tabs (Aspirin) .Marland Kitchen... Take 1 tablet by mouth once a day    Metformin Hcl 1000 Mg Tabs (Metformin hcl) .Marland Kitchen... Take 1 tablet by mouth two times a day    Lisinopril 40 Mg Tabs (Lisinopril) .Marland Kitchen... Take 1 tablet by mouth once a day  Labs Reviewed: Creat: 0.94 (12/02/2009)   Microalbumin: 10 (04/07/2010)  Last Eye Exam: normal (12/08/2009) Reviewed HgBA1c results: 6.7 (05/26/2009)  6.3 (10/01/2008)  Problem # 3:  URINARY OBSTRUCTION UNSPECIFIED (ICD-599.60) Will try changing to Proscar since no difference with the flomas. Sometime we do additive therapy.   Complete Medication List: 1)  Bayer Aspirin 325 Mg Tabs (Aspirin) .... Take 1 tablet by mouth once a day 2)  Lovaza 1 Gm Caps (Omega-3-acid ethyl esters) .... 2 caps two times a day 3)  Metformin Hcl 1000 Mg Tabs (Metformin hcl) .... Take 1 tablet by mouth two times a day 4)  Calcium 500 Mg Tabs (Calcium carbonate) .... Take 1 tablet by mouth once a day 5)  Lisinopril 40 Mg Tabs (Lisinopril) .... Take 1 tablet by mouth once a day 6)  Glucometer He Has Is One Touch Ultra 2.  .... Needs lancet, strips to test 2 x a day. dx:250.00 7)  Walker With Financial risk analyst  .... Dx: alzheimers and seizure d/o, jonit pain 8)  Zoloft 100 Mg Tabs (Sertraline hcl) .... Take one tablet by mouth at bedtime 9)  Cinnamon 500 Mg Tabs (Cinnamon) .... 1.000mg  at lunch 10)  Multivitamins Tabs (Multiple vitamin) .... Take 1 tablet by mouth once a day 11)  Levothroid 50 Mcg Tabs (Levothyroxine sodium) .... Take 1 tablet by mouth once a day 12)  Lamictal 100  Mg Tabs (Lamotrigine) .... Take 100mg  in the am and 200 mg in the pm 13)  Finasteride 5 Mg Tabs (Finasteride) .... Take 1 tablet by mouth once a day 14)  Niaspan 500 Mg Cr-tabs (Niacin (antihyperlipidemic)) .... Take 1 tablet by mouth once a day  Diabetes Management Assessment/Plan:      His blood pressure goal is < 130/80.    Patient Instructions: 1)  Exercises for the piriformis muscle and Aleve two times a day with food and water for 5 days and then just  as needed. Call if not better in 3 weeks.  2)  Please schedule a follow-up appointment in 2 months for diabetes.  Prescriptions: FINASTERIDE 5 MG TABS (FINASTERIDE) Take 1 tablet by mouth once a day  #30 x 0   Entered and Authorized by:   Nani Gasser MD   Signed by:   Nani Gasser MD on 05/14/2010   Method used:   Electronically to        [facility] [phone]* (retail)       [address] [place], [place]  [address]       Ph: [phone]       Fax: [phone]   RxID:   [id number]

## 2010-09-07 NOTE — Letter (Signed)
Summary: [facility]  [facility]   Imported By: Lanelle Bal 07/05/2010 08:52:22  _____________________________________________________________________  External Attachment:    Type:   Image     Comment:   External Document

## 2010-09-07 NOTE — Letter (Signed)
Summary: Instructions [facility] Meds/[facility]  Instructions & Meds/[facility]   Imported By: Lanelle Bal 09/05/2009 11:24:38  _____________________________________________________________________  External Attachment:    Type:   Image     Comment:   External Document

## 2010-09-07 NOTE — Letter (Signed)
Summary: [facility]  [facility]   Imported By: Lanelle Bal 03/04/2010 10:30:21  _____________________________________________________________________  External Attachment:    Type:   Image     Comment:   External Document

## 2010-09-07 NOTE — Assessment & Plan Note (Signed)
Summary: Neck and Back pain   Vital Signs:  Patient profile:   75 year old male Height:      67 inches Weight:      174 pounds Pulse rate:   98 / minute BP sitting:   97 / 54  (right arm) Cuff size:   regular  Vitals Entered By: Avon Gully CMA, Duncan Dull) (May 25, 2010 1:18 PM) CC: pain that radiates from neck to lower back to rt leg,med rx'ed last visit has not helped   Primary Care Taelor Waymire:  Linford Arnold, c  CC:  pain that radiates from neck to lower back to rt leg and med rx'ed last visit has not helped.  History of Present Illness: pain that radiates from neck to lower back to rt leg,med rx'ed last visit has not helped.Tried the NSAID adn then the tramadol but is it not helping.  Pain now in his neck and radiating down his pain. Sitting helps it. Tramadol isn't helping. No fever or URI. Sxs. No urinary sxs above his baseline.  He still urinates frequently at night and has to sit to urinate.    Of note he has lost 5 lbs since last visit and his BP is low today.  Says he really didn't eat much yesterday.  patsy his wife notes he has been choking more often lately with foods and drinks.   Current Medications (verified): 1)  Bayer Aspirin 325 Mg Tabs (Aspirin) .... Take 1 Tablet By Mouth Once A Day 2)  Lovaza 1 Gm Caps (Omega-3-Acid Ethyl Esters) .... 2 Caps Two Times A Day 3)  Metformin Hcl 1000 Mg Tabs (Metformin Hcl) .... Take 1 Tablet By Mouth Two Times A Day 4)  Calcium 500 Mg Tabs (Calcium Carbonate) .... Take 1 Tablet By Mouth Once A Day 5)  Lisinopril 40 Mg Tabs (Lisinopril) .... Take 1 Tablet By Mouth Once A Day 6)  Glucometer He Has Is One Touch Ultra 2. .... Needs Lancet, Strips To Test 2 X A Day. Dx:250.00 7)  Walker With Financial risk analyst .... Dx: Alzheimers and Seizure D/o, [name] 8)  Zoloft 100 Mg Tabs (Sertraline Hcl) .... Take One Tablet By Mouth At Bedtime 9)  Cinnamon 500 Mg Tabs (Cinnamon) .... 1.000mg  At Lunch 10)  Multivitamins  Tabs (Multiple  Vitamin) .... Take 1 Tablet By Mouth Once A Day 11)  Levothroid 50 Mcg Tabs (Levothyroxine Sodium) .... Take 1 Tablet By Mouth Once A Day 12)  Lamictal 100 Mg Tabs (Lamotrigine) .... Take 100mg  in The Am and 200 Mg in The Pm 13)  Finasteride 5 Mg Tabs (Finasteride) .... Take 1 Tablet By Mouth Once A Day 14)  Niaspan 500 Mg Cr-Tabs (Niacin (Antihyperlipidemic)) .... Take 1 Tablet By Mouth Once A Day 15)  Tramadol Hcl 50 Mg Tabs (Tramadol Hcl) .... Take 1 Tablet By Mouth Three Times A Day As Needed For Back Pain  Allergies (verified): 1)  ! * Hctz 2)  ! Lipitor 3)  * Some Cholesterol Meds  Comments:  Nurse/Medical Assistant: The patient's medications and allergies were reviewed with the patient and were updated in the Medication and Allergy Lists. Avon Gully CMA, Duncan Dull) (May 25, 2010 1:19 PM)  Past History:  Past Medical History: CAD (ICD-414.00)- PCI of his LAD as well as his right coronary artery.   Past Surgical History: Cholecystectomy,  PCI right coronary artery and left anterior descending Cervical spine decompression.   Family History:  Negative for coronary artery disease.  Physical Exam  General:  Well-developed,well-nourished,in no acute distress; alert,appropriate and cooperative throughout examination. Skin feels clammy.  Head:  Normocephalic and atraumatic without obvious abnormalities. No apparent alopecia or balding. Neck:  No deformities, masses, or tenderness noted. NO TM.  Lungs:  Normal respiratory effort, chest expands symmetrically. Lungs are clear to auscultation, no crackles or wheezes. Heart:  Normal rate and regular rhythm. S1 and S2 normal without gallop, murmur, click, rub or other extra sounds. Abdomen:  Bowel sounds positive,abdomen soft and non-tender without masses, organomegaly or hernias noted. Msk:  Neck with NROM. Nontender cervical spine. mIld tenderness over the right paraspinous muscles. Shoulders with NROM.   Lumbar flexion,  extension decreased. Neg striaght leg raise.  Hip, knee and ankle strength 5/5 bilt.  Skin:  no rashes.   Cervical Nodes:  No lymphadenopathy noted Psych:  Cognition and judgment appear intact. Alert and cooperative with normal attention span and concentration. No apparent delusions, illusions, hallucinations   Impression & Recommendations:  Problem # 1:  CERVICALGIA (ICD-723.1) Neck pain may be related to his surgery or may be muscle spasm around the area.  Certainly he has had a post op MRI thatshowed the area has been decompressed.  Given hydrocodone for improved pain relief. Also given DepoMedrol and Toradal in jection in the office for acute pain relief.  Given steroid injection for acute pain today.  His updated medication list for this problem includes:    Bayer Aspirin 325 Mg Tabs (Aspirin) .Marland Kitchen... Take 1 tablet by mouth once a day    Tramadol Hcl 50 Mg Tabs (Tramadol hcl) .Marland Kitchen... Take 1 tablet by mouth three times a day as needed for back pain    Hydrocodone-acetaminophen 5-325 Mg Tabs (Hydrocodone-acetaminophen) .Marland Kitchen... 1-2 tabs by mouth every 6 hours as needed  Orders: T-Comprehensive Metabolic Panel ([id number])  Problem # 2:  WEIGHT LOSS (ICD-783.21) Dsicussed that he may be dehydrated. Tried to check a UA in the office but he was unable to give a sample so will check CMP and a CBC. He also felt clammy in the office.   Problem # 3:  HYPERTENSION (ICD-401.9) Bp is low today. Hold the lisinopril until his BP comes back up.  His updated medication list for this problem includes:    Lisinopril 40 Mg Tabs (Lisinopril) .Marland Kitchen... Take 1 tablet by mouth once a day  Complete Medication List: 1)  Bayer Aspirin 325 Mg Tabs (Aspirin) .... Take 1 tablet by mouth once a day 2)  Lovaza 1 Gm Caps (Omega-3-acid ethyl esters) .... 2 caps two times a day 3)  Metformin Hcl 1000 Mg Tabs (Metformin hcl) .... Take 1 tablet by mouth two times a day 4)  Calcium 500 Mg Tabs (Calcium carbonate) .... Take 1  tablet by mouth once a day 5)  Lisinopril 40 Mg Tabs (Lisinopril) .... Take 1 tablet by mouth once a day 6)  Glucometer He Has Is One Touch Ultra 2.  .... Needs lancet, strips to test 2 x a day. dx:250.00 7)  Walker With Financial risk analyst  .... Dx: alzheimers and seizure d/o, jonit pain 8)  Zoloft 100 Mg Tabs (Sertraline hcl) .... Take one tablet by mouth at bedtime 9)  Cinnamon 500 Mg Tabs (Cinnamon) .... 1.000mg  at lunch 10)  Multivitamins Tabs (Multiple vitamin) .... Take 1 tablet by mouth once a day 11)  Levothroid 50 Mcg Tabs (Levothyroxine sodium) .... Take 1 tablet by mouth once a day 12)  Lamictal 100 Mg Tabs (Lamotrigine) .... Take 100mg  in the  am and 200 mg in the pm 13)  Finasteride 5 Mg Tabs (Finasteride) .... Take 1 tablet by mouth once a day 14)  Niaspan 500 Mg Cr-tabs (Niacin (antihyperlipidemic)) .... Take 1 tablet by mouth once a day 15)  Tramadol Hcl 50 Mg Tabs (Tramadol hcl) .... Take 1 tablet by mouth three times a day as needed for back pain 16)  Hydrocodone-acetaminophen 5-325 Mg Tabs (Hydrocodone-acetaminophen) .Marland Kitchen.. 1-2 tabs by mouth every 6 hours as needed  Other Orders: T-TSH ([id number]) T-CBC w/Diff ([id number]) Depo- Medrol 80mg  (J1040) Ketorolac-Toradol 15mg  (Q0347) Admin of Therapeutic Inj (IM or [place]) (42595) UA Dipstick w/o Micro (automated)  (81003)  Patient Instructions: 1)  Hold the lisinopril.  Prescriptions: HYDROCODONE-ACETAMINOPHEN 5-325 MG TABS (HYDROCODONE-ACETAMINOPHEN) 1-2 tabs by mouth every 6 hours as needed  #45 x 0   Entered and Authorized by:   Nani Gasser MD   Signed by:   Nani Gasser MD on 05/25/2010   Method used:   Printed then faxed to ...       [address] [phone]* (retail)       [address] [place], [place]  [address]       Ph: [phone]       Fax: [phone]   RxID:   [id number]    Medication Administration  Injection # 1:    Medication: Depo- Medrol 80mg     Diagnosis: SCIATICA  (ICD-724.3)    Route: IM    Site: LUOQ gluteus    Lot #: [id number]    Mfr: Pharmacia    Patient tolerated injection without complications    Given by: Avon Gully CMA, Duncan Dull) (May 25, 2010 2:42 PM)  Injection # 2:    Medication: Ketorolac-Toradol 15mg     Diagnosis: SCIATICA (ICD-724.3)    Route: IM    Site: RUOQ gluteus    Exp Date: 10/07/2011    Lot #: [id number]    Mfr: Hospira    Patient tolerated injection without complications    Given by: Avon Gully CMA, Duncan Dull) (May 25, 2010 2:43 PM)  Orders Added: 1)  T-Comprehensive Metabolic Panel [[id number]] 2)  T-TSH [phone] 3)  T-CBC w/Diff [[id number]] 4)  Depo- Medrol 80mg  [J1040] 5)  Ketorolac-Toradol 15mg  [J1885] 6)  Admin of Therapeutic Inj (IM or [place]) [96372] 7)  Est. Patient Level IV [54627] 8)  UA Dipstick w/o Micro (automated)  [81003]    Laboratory Results   Urine Tests  Date/Time Received: 05/26/10 Date/Time Reported: 05/26/10  Routine Urinalysis   Color: yellow Appearance: Clear Glucose: negative   (Normal Range: Negative) Bilirubin: small   (Normal Range: Negative) Ketone: small (15)   (Normal Range: Negative) Spec. Gravity: 1.025   (Normal Range: 1.003-1.035) Blood: negative   (Normal Range: Negative) pH: 5.0   (Normal Range: 5.0-8.0) Protein: negative   (Normal Range: Negative) Urobilinogen: 0.2   (Normal Range: 0-1) Nitrite: negative   (Normal Range: Negative) Leukocyte Esterace: negative   (Normal Range: Negative)

## 2010-09-07 NOTE — Assessment & Plan Note (Signed)
Summary: [facility]   Visit Type:  Follow-up Primary Provider:  Linford Arnold, c  CC:  Sob- weakness.  History of Present Illness: Mr. [name] is a very pleasant gentleman with a history of coronary disease.  He has had previous PCI of his LAD as well as his right coronary artery in 2008.  The right coronary artery was a PROMUS drug-eluting stent. Ejection fraction is preserved. I last saw him in August of 2010. Since then he is complaining of some dyspnea predominantly when he bends over. There is some dyspnea with activities as well. There is no orthopnea, PND, pedal edema or chest pain. He is also having significant fatigue. He is also falling but denies any syncope.  Current Medications (verified): 1)  Bayer Aspirin 325 Mg Tabs (Aspirin) .... Take 1 Tablet By Mouth Once A Day 2)  Lovaza 1 Gm Caps (Omega-3-Acid Ethyl Esters) .... 2 Caps Two Times A Day 3)  Metformin Hcl 500 Mg Tabs (Metformin Hcl) .... Take 2 Tablet By Mouth in The Am, and One in The Pm. 4)  Metoprolol Tartrate 25 Mg Tabs (Metoprolol Tartrate) .... Take 1/2 Tablet By Mouth Two Times A Day 5)  Lamictal 100 Mg Tabs (Lamotrigine) .... Take One Tablet Am and 1 1/2 At Bedtime 6)  Calcium 500 Mg Tabs (Calcium Carbonate) .... Take 1 Tablet By Mouth Once A Day 7)  Lisinopril 40 Mg Tabs (Lisinopril) .... Take 1 Tablet By Mouth Once A Day 8)  Glucometer He Has Is One Touch Ultra 2. .... Needs Lancet, Strips To Test 2 X A Day. Dx:250.00 9)  Amlodipine Besylate 5 Mg Tabs (Amlodipine Besylate) .... Take 1 Tablet By Mouth Once A Day 10)  Walker With Chair and Hand Brakes .... Dx: Alzheimers and Seizure D/o, [name] 11)  Zoloft 100 Mg Tabs (Sertraline Hcl) .... Take One Tablet By Mouth At Bedtime 12)  Cinnamon 500 Mg Tabs (Cinnamon) .... 1.000mg  At Lunch 13)  Multivitamins  Tabs (Multiple Vitamin) .... Take 1 Tablet By Mouth Once A Day 14)  Namenda 10 Mg Tabs (Memantine Hcl) .... Started Pack Two Times A Day  Allergies: 1)  ! *  Hctz 2)  * Some Cholesterol Meds  Past History:  Past Medical History: CAD (ICD-414.00)- PCI of his LAD as well as his right coronary artery.  HYPERLIPIDEMIA (ICD-272.4) HYPERTENSION (ICD-401.9) HYPOTHYROIDISM (ICD-244.9) HYPOGONADISM (ICD-257.2) DIABETES MELLITUS, TYPE II (ICD-250.00) DEPRESSION (ICD-311) MUSCLE WEAKNESS (GENERALIZED) (ICD-728.87) CLOSED FRACTURE METACARPAL BONE SITE UNSPECIFIED (ICD-815.00) SEIZURE DISORDER (ICD-780.39) BAKER'S CYST, LEFT KNEE (ICD-727.51) ERECTILE DYSFUNCTION (ICD-302.72) OBSTRUCTIVE SLEEP APNEA (ICD-327.23) ALZHEIMER'S DISEASE, MODERATE (ICD-331.0)  Past Surgical History: Reviewed history from 03/18/2009 and no changes required. Cholecystectomy,  PCI right coronary artery and left anterior descending  Social History: Reviewed history from 03/07/2009 and no changes required. 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  Married to [place] with 2 adult children.  Quit smoking 1990, no EtOH, no drugs, no caffeine, walks for 46-60 min daily.  Review of Systems       Problems with fatigue and balance as well as hand arthralgias but no fevers or chills, productive cough, hemoptysis, dysphasia, odynophagia, melena, hematochezia, dysuria, hematuria, rash, seizure activity, orthopnea, PND, pedal edema, claudication. Remaining systems are negative.   Vital Signs:  Patient profile:   75 year old male Height:      67 inches Weight:      184 pounds BMI:     28.92 Pulse rate:   57 / minute Pulse rhythm:  regular Resp:     20 per minute BP sitting:   130 / 70  (left arm) Cuff size:   large  Vitals Entered By: Vikki Ports (December 02, 2009 9:20 AM)  Physical Exam  General:  Well-developed well-nourished in no acute distress.  Skin is warm and dry.  HEENT is normal.  Neck is supple. No thyromegaly.  Chest is clear to auscultation with normal expansion.  Cardiovascular exam is regular rate and rhythm.  Abdominal exam nontender  or distended. No masses palpated. Extremities show no edema. neuro significant for decreased memory    EKG  Procedure date:  12/02/2009  Findings:      Sinus bradycardia at a rate of 57. Axis normal. Nonspecific ST changes.  Impression & Recommendations:  Problem # 1:  FATIGUE (ICD-780.79) Etiology unclear. Discontinue Lopressor. Check TSH, hemoglobin. Patient also complains of dyspnea. Check BNP. Not volume overloaded on examination.  Problem # 2:  CAD (ICD-414.00) Continue aspirin and ACE inhibitor. I will not add a statin as he is complaining of muscle weakness and this will most likely cloud the picture at present. Given complaints of dyspnea I will schedule a Myoview for risk stratification. It has been 3 years since his prior intervention. The following medications were removed from the medication list:    Metoprolol Tartrate 25 Mg Tabs (Metoprolol tartrate) .Marland Kitchen... Take 1/2 tablet by mouth two times a day His updated medication list for this problem includes:    Bayer Aspirin 325 Mg Tabs (Aspirin) .Marland Kitchen... Take 1 tablet by mouth once a day    Lisinopril 40 Mg Tabs (Lisinopril) .Marland Kitchen... Take 1 tablet by mouth once a day    Amlodipine Besylate 5 Mg Tabs (Amlodipine besylate) .Marland Kitchen... Take 1 tablet by mouth once a day  Orders: T-BNP  (B Natriuretic Peptide) (84132-44010) T-CBC No Diff (27253-66440) Nuclear Stress Test (Nuc Stress Test)  Problem # 3:  HYPERLIPIDEMIA (ICD-272.4) Continue present medications. His updated medication list for this problem includes:    Lovaza 1 Gm Caps (Omega-3-acid ethyl esters) .Marland Kitchen... 2 caps two times a day  Problem # 4:  HYPERTENSION (ICD-401.9) Blood pressure controlled on present medications. Check Bmet The following medications were removed from the medication list:    Metoprolol Tartrate 25 Mg Tabs (Metoprolol tartrate) .Marland Kitchen... Take 1/2 tablet by mouth two times a day His updated medication list for this problem includes:    Bayer Aspirin 325 Mg Tabs  (Aspirin) .Marland Kitchen... Take 1 tablet by mouth once a day    Lisinopril 40 Mg Tabs (Lisinopril) .Marland Kitchen... Take 1 tablet by mouth once a day    Amlodipine Besylate 5 Mg Tabs (Amlodipine besylate) .Marland Kitchen... Take 1 tablet by mouth once a day  Orders: T-Basic Metabolic Panel [phone])  Problem # 5:  DEMENTIA (ICD-294.8)  Problem # 6:  HYPOTHYROIDISM (ICD-244.9) Check TSH. The following medications were removed from the medication list:    Levothroid 25 Mcg Tabs (Levothyroxine sodium) .Marland Kitchen... Take 1 tablet by mouth once a day in the am  Orders: T-TSH ([id number])  Problem # 7:  DIABETES MELLITUS, TYPE II (ICD-250.00)  His updated medication list for this problem includes:    Bayer Aspirin 325 Mg Tabs (Aspirin) .Marland Kitchen... Take 1 tablet by mouth once a day    Metformin Hcl 500 Mg Tabs (Metformin hcl) .Marland Kitchen... Take 2 tablet by mouth in the am, and one in the pm.    Lisinopril 40 Mg Tabs (Lisinopril) .Marland Kitchen... Take 1 tablet by mouth once a day  Problem #  8:  OBSTRUCTIVE SLEEP APNEA (ICD-327.23)  Patient Instructions: 1)  Your physician recommends that you schedule a follow-up appointment in: 3 MONTHS 2)  Your physician has requested that you have an LEXISCAN stress myoview.  For further information please visit [email or web address]  Please follow instruction sheet, as given. 3)  Your physician has recommended you make the following change in your medication: STOP METOPROLOL  Appended Document: [facility]   Diabetes Management History:      The patient is a 75 years old male who comes in for evaluation of DM Type 2.  He has not been enrolled in the "Diabetic Education Program".  He is checking home blood sugars.  He says that he is exercising.  Type of exercise includes: walking.    Diabetes Management Exam:    Eye Exam:       Eye Exam done elsewhere          Date: 12/08/2009          Results: normal          Done by: [facility]  Diabetes Management Assessment/Plan:      His blood pressure  goal is < 130/80.       Past History:  Past Medical History: CAD (ICD-414.00)- PCI of his LAD as well as his right coronary artery.  ALZHEIMER'S DISEASE, MODERATE (ICD-331.0) Bilat cataracts followedy by [facility]

## 2010-09-07 NOTE — Progress Notes (Signed)
Summary: Nuclear Pre-Procedure  Phone Note Outgoing Call Call back at [facility] Phone [phone]   Call placed by: Stanton Kidney, EMT-P,  Dec 10, 2009 1:25 PM Action Taken: Phone Call Completed Summary of Call: Left message with information on Myoview Information Sheet (see scanned document for details).     Nuclear Med Background Indications for Stress Test: Evaluation for Ischemia, Stent Patency, PTCA Patency   History: Angioplasty, Echo, Heart Catheterization, Myocardial Perfusion Study, Stents  History Comments: 4/08 Echo: EF= 65% 4/08 MPS: EF=68%, mild/mod. ischemia-inferior 7/08 Heart Cath: EF=60%, RCA-95%, LAD-90%, res. CFX-60% > Stents: RCA, LAD  Symptoms: DOE, Fatigue    Nuclear Pre-Procedure Cardiac Risk Factors: History of Smoking, Hypertension, Lipids, NIDDM Height (in): 67

## 2010-09-07 NOTE — Progress Notes (Signed)
Summary: Pain med for back pain  Phone Note Call from Patient Call back at Home Phone [phone]   Caller: Spouse Call For: Nani Gasser MD Summary of Call: Pt wife calls and states that [name] is still having lower back pain and the Aleve is not helping and would like something different called into pharmacy for him Initial call taken by: Kathlene November LPN,  May 20, 2010 10:00 AM  Follow-up for Phone Call        Lets try tramadol. Call if not helping.  Follow-up by: Nani Gasser MD,  May 20, 2010 10:19 AM  Additional Follow-up for Phone Call Additional follow up Details #1::        Wife notified and med sent Additional Follow-up by: Kathlene November LPN,  May 20, 2010 10:51 AM    New/Updated Medications: TRAMADOL HCL 50 MG TABS (TRAMADOL HCL) Take 1 tablet by mouth three times a day as needed for back pain Prescriptions: TRAMADOL HCL 50 MG TABS (TRAMADOL HCL) Take 1 tablet by mouth three times a day as needed for back pain  #60 x 1   Entered and Authorized by:   Nani Gasser MD   Signed by:   Nani Gasser MD on 05/20/2010   Method used:   Electronically to        [facility] [phone]* (retail)       [address] [place], [place]  [address]       Ph: [phone]       Fax: [phone]   RxID:   [id number]

## 2010-09-07 NOTE — Assessment & Plan Note (Signed)
Summary: Cardiology Nuclear Study  Nuclear Med Background Indications for Stress Test: Evaluation for Ischemia, Stent Patency, PTCA Patency   History: Angioplasty, Echo, Heart Catheterization, Myocardial Perfusion Study, Stents  History Comments: 4/08 Echo: EF= 65% 4/08 MPS: EF=68%, mild/mod. ischemia-inferior 7/08 Heart Cath: EF=60%, RCA-95%, LAD-90%, res. CFX-60% > Stents: RCA, LAD  Symptoms: DOE, Fatigue, Palpitations    Nuclear Pre-Procedure Cardiac Risk Factors: History of Smoking, Hypertension, Lipids, NIDDM Caffeine/Decaff Intake: None NPO After: 6:00 PM Lungs: clear IV 0.9% NS with Angio Cath: 20g     IV Site: (R) AC IV Started by: Irean Hong RN Chest Size (in) 42     Height (in): 67 Weight (lb): 180 BMI: 28.29  Nuclear Med Study 1 or 2 day study:  1 day     Stress Test Type:  Eugenie Birks Reading MD:  Dietrich Pates, MD     Referring MD:  B.Crenshaw Resting Radionuclide:  Technetium 61m Tetrofosmin     Resting Radionuclide Dose:  10.8 mCi  Stress Radionuclide:  Technetium 53m Tetrofosmin     Stress Radionuclide Dose:  33 mCi   Stress Protocol   Lexiscan: 0.4 mg   Stress Test Technologist:  Milana Na EMT-P     Nuclear Technologist:  Domenic Polite CNMT  Rest Procedure  Myocardial perfusion imaging was performed at rest 45 minutes following the intravenous administration of Myoview Technetium 59m Tetrofosmin.  Stress Procedure  The patient received IV Lexiscan 0.4 mg over 15-seconds.  Myoview injected at 30-seconds. This pstient had no symptoms and a rare pvc with infusion. There were no significant changes with infusion.  Quantitative spect images were obtained after a 45 minute delay.  QPS Raw Data Images:  Images were motion corrected.  SOft tissue (diaphragm) underlies heart. Stress Images:  There is normal uptake in all areas. Rest Images:  Normal homogeneous uptake in all areas of the myocardium. Subtraction (SDS):  No evidence of ischemia. Transient  Ischemic Dilatation:  1.13  (Normal <1.22)  Lung/Heart Ratio:  .3  (Normal <0.45)  Quantitative Gated Spect Images QGS EDV:  72 ml QGS ESV:  21 ml QGS EF:  71 %   Overall Impression  Exercise Capacity: Lexiscan protocol. BP Response: Normal blood pressure response. Clinical Symptoms: No chest pain ECG Impression: No significant ST segment change suggestive of ischemia. Overall Impression: Normal stress nuclear study.  Appended Document: Cardiology Nuclear Study ok  Appended Document: Cardiology Nuclear Study Left message to call back    Appended Document: Cardiology Nuclear Study pt aware of results

## 2010-09-07 NOTE — Letter (Signed)
Summary: [facility]  [facility]   Imported By: Lanelle Bal 02/26/2010 13:18:50  _____________________________________________________________________  External Attachment:    Type:   Image     Comment:   External Document

## 2010-09-07 NOTE — Assessment & Plan Note (Signed)
Summary: NURSE  Nurse Visit   Vital Signs:  Patient profile:   75 year old male BP sitting:   109 / 61  (right arm) Cuff size:   regular  Serial Vital Signs/Assessments:  Time      Position  BP       Pulse  Resp  Temp     By 1:23 PM             121/76                         Avon Gully CMA, (AAMA)  Comments: 1:23 PM 121/76 is the BP on pt's machine By: Avon Gully CMA, Duncan Dull)    Primary Care Provider:  Linford Arnold, c   History of Present Illness: Nurse visit to compare glucose machines and BP machines, UA, microalb, and urine culture   Allergies: 1)  ! * Hctz 2)  ! Lipitor 3)  * Some Cholesterol Meds Laboratory Results   Urine Tests  Date/Time Received: 04/07/2010 Date/Time Reported: 04/07/2010  Routine Urinalysis   Color: yellow Appearance: Clear Glucose: negative   (Normal Range: Negative) Bilirubin: negative   (Normal Range: Negative) Ketone: negative   (Normal Range: Negative) Spec. Gravity: 1.020   (Normal Range: 1.003-1.035) Blood: negative   (Normal Range: Negative) pH: 6.0   (Normal Range: 5.0-8.0) Protein: negative   (Normal Range: Negative) Urobilinogen: 0.2   (Normal Range: 0-1) Nitrite: negative   (Normal Range: Negative) Leukocyte Esterace: negative   (Normal Range: Negative)  Microalbumin (urine): 10 mg/L Creatinine: 100mg /dL  A:C Ratio <16/XW/R  Blood Tests   Date/Time Received: 04/07/10 Date/Time Reported: 04/07/10  CBG Random:: 254mg /dL  Comments: 604 was the CBG for pt's machine    Immunizations Administered:  Influenza Vaccine # 1:    Vaccine Type: Fluvax 3+    Site: left deltoid    Mfr: Fluarix    Dose: 0.5 ml    Route: IM    Given by: Sue Lush McCrimmon CMA, (AAMA)    Exp. Date: 02/05/2011    Lot #: [id number]    VIS given: 03/01/07 version given April 07, 2010.  Flu Vaccine Consent Questions:    Do you have a history of severe allergic reactions to this vaccine? no    Any prior history of allergic reactions to  egg and/or gelatin? no    Do you have a sensitivity to the preservative Thimersol? no    Do you have a past history of Guillan-Barre Syndrome? no    Do you currently have an acute febrile illness? no    Have you ever had a severe reaction to latex? no    Vaccine information given and explained to patient? no  Orders Added: 1)  Creatinine  [82570] 2)  Urinalysis-dipstick only (Medicare patient) [81003QW] 3)  Urine Microalbumin [82044] 4)  UA Dipstick w/o Micro (automated)  [81003] 5)  Fingerstick [36416] 6)  Glucose, (CBG) [82962] 7)  Flu Vaccine 44yrs + [90658] 8)  Admin 1st Vaccine [90471] 9)  T-Culture, Urine [[id number]]   Appended Document: NURSE Call pt: Hold/Stop the amlodipine.Increase metfomrin to 1000 two times a day ( was taking 1000mg  total in AM, and 500 in PM).  Check fasting 3 days a week and call me after 2 weeks and let me know if sugars under 130 fasting but not too low. Keep f/u appt in 3 months September  1, 20115:19 PM Metheney MD, Santina Evans   04/09/2010 @ 8:50am- Pt wife notified of  MD instructions. Wife verbalized understanding. kJ LPN

## 2010-09-07 NOTE — Medication Information (Signed)
Summary: [facility]  [facility]   Imported By: Lanelle Bal 01/26/2010 11:36:00  _____________________________________________________________________  External Attachment:    Type:   Image     Comment:   External Document

## 2010-09-07 NOTE — Medication Information (Signed)
Summary: [facility]  [facility]   Imported By: Lanelle Bal 09/05/2009 11:21:21  _____________________________________________________________________  External Attachment:    Type:   Image     Comment:   External Document

## 2010-09-07 NOTE — Assessment & Plan Note (Signed)
Summary: Acute sinusitis   Vital Signs:  Patient profile:   75 year old male Height:      67 inches Weight:      187 pounds O2 Sat:      97 % on Room air Temp:     97.8 degrees F oral Pulse rate:   57 / minute BP sitting:   112 / 55  (left arm) Cuff size:   large  Vitals Entered By: Kathlene November (November 05, 2009 1:35 PM)  O2 Flow:  Room air CC: cough day and night for 3-4 days. Started Mucous relief expectorant and Wal-finate antihistamine at advice of pharmacist and wife states this is helping. home monitor read is 142/64 P=51   Primary Care Provider:  Saliah Crisp, c  CC:  cough day and night for 3-4 days. Started Mucous relief expectorant and Wal-finate antihistamine at advice of pharmacist and wife states this is helping. home monitor read is 142/64 P=51.  History of Present Illness: cough day and night for 3-4 days. Started Mucous relief expectorant and Wal-finate antihistamine at advice of pharmacist and wife states this is helping. home monitor read is 142/64 P=51. can hear phlegm. Constantly with nasal congestion and runny nose.   Never fever.  No ear pressure.  No HA or facial pressure.   Current Medications (verified): 1)  Bayer Aspirin 325 Mg Tabs (Aspirin) .... Take 1 Tablet By Mouth Once A Day 2)  Lovaza 1 Gm Caps (Omega-3-Acid Ethyl Esters) .... 2 Caps Two Times A Day 3)  Aricept 10 Mg Tabs (Donepezil Hcl) .... Take 2 Tabs By Mouth Once A Day 4)  Metformin Hcl 500 Mg Tabs (Metformin Hcl) .... Take 2 Tablet By Mouth in The Am, and One in The Pm. 5)  Metoprolol Tartrate 25 Mg Tabs (Metoprolol Tartrate) .... Take 1/2 Tablet By Mouth Two Times A Day 6)  Lamictal 100 Mg Tabs (Lamotrigine) .... Take One Tablet Am and 1 1/2 At Bedtime 7)  Levothroid 25 Mcg Tabs (Levothyroxine Sodium) .... Take 1 Tablet By Mouth Once A Day in The Am 8)  Calcium 500 Mg Tabs (Calcium Carbonate) .... Take 1 Tablet By Mouth Once A Day 9)  Lisinopril 40 Mg Tabs (Lisinopril) .... Take 1 Tablet By Mouth  Once A Day 10)  Vitamin C 500 Mg  Tabs (Ascorbic Acid) .... Take 1 Tablet By Mouth Once A Day 11)  Glucometer He Has Is One Touch Ultra 2. .... Needs Lancet, Strips To Test 2 X A Day. Dx:250.00 12)  Amlodipine Besylate 5 Mg Tabs (Amlodipine Besylate) .... Take 1 Tablet By Mouth Once A Day 13)  Walker With Chair and [employer] .... Dx: Alzheimers and Seizure D/o, [name] 14)  Zoloft 100 Mg Tabs (Sertraline Hcl) .... Take One Tablet By Mouth At Bedtime 15)  Namenda Titration Pak 5 (28)-10 (21) Mg Tabs (Memantine Hcl) 16)  Baclofen 10 Mg Tabs (Baclofen) .... Take One Tablet By Mouth Three Times A Day  Allergies (verified): 1)  ! * Hctz 2)  * Some Cholesterol Meds  Comments:  Nurse/Medical Assistant: The patient's medications and allergies were reviewed with the patient and were updated in the Medication and Allergy Lists. Kathlene November (November 05, 2009 1:38 PM)  Physical Exam  General:  Well-developed,well-nourished,in no acute distress; alert,appropriate and cooperative throughout examination Head:  Normocephalic and atraumatic without obvious abnormalities. No apparent alopecia or balding. Eyes:  No corneal or conjunctival inflammation noted. EOMI. Perrla. Ears:  External ear exam shows no  significant lesions or deformities.  Otoscopic examination reveals clear canals, tympanic membranes are intact bilaterally without bulging, retraction, inflammation or discharge. Hearing is grossly normal bilaterally. Nose:  External nasal examination shows no deformity or inflammation. Nasal mucosa are pink and moist without lesions or exudates. Mild turbinate swelling.  Mouth:  Oral mucosa and oropharynx without lesions or exudates.  Teeth in good repair. Neck:  No deformities, masses, or tenderness noted. Lungs:  Normal respiratory effort, chest expands symmetrically. Lungs are clear to auscultation, no crackles or wheezes. Heart:  Normal rate and regular rhythm. S1 and S2 normal without gallop,  murmur, click, rub or other extra sounds. Pulses:  Radial 2+  Skin:  no rashes.     Impression & Recommendations:  Problem # 1:  SINUSITIS - ACUTE-NOS (ICD-461.9)  His updated medication list for this problem includes:    Amoxicillin 875 Mg Tabs (Amoxicillin) .Marland Kitchen... Take 1 tablet by mouth two times a day for 14 days  Instructed on treatment. Call if symptoms persist or worsen. Call if not better in one week.  Samples of omnaris given to try. Can contiue the [employer] brand antihistamine.   Complete Medication List: 1)  Bayer Aspirin 325 Mg Tabs (Aspirin) .... Take 1 tablet by mouth once a day 2)  Lovaza 1 Gm Caps (Omega-3-acid ethyl esters) .... 2 caps two times a day 3)  Aricept 10 Mg Tabs (Donepezil hcl) .... Take 2 tabs by mouth once a day 4)  Metformin Hcl 500 Mg Tabs (Metformin hcl) .... Take 2 tablet by mouth in the am, and one in the pm. 5)  Metoprolol Tartrate 25 Mg Tabs (Metoprolol tartrate) .... Take 1/2 tablet by mouth two times a day 6)  Lamictal 100 Mg Tabs (Lamotrigine) .... Take one tablet am and 1 1/2 at bedtime 7)  Levothroid 25 Mcg Tabs (Levothyroxine sodium) .... Take 1 tablet by mouth once a day in the am 8)  Calcium 500 Mg Tabs (Calcium carbonate) .... Take 1 tablet by mouth once a day 9)  Lisinopril 40 Mg Tabs (Lisinopril) .... Take 1 tablet by mouth once a day 10)  Vitamin C 500 Mg Tabs (Ascorbic acid) .... Take 1 tablet by mouth once a day 11)  Glucometer He Has Is One Touch Ultra 2.  .... Needs lancet, strips to test 2 x a day. dx:250.00 12)  Amlodipine Besylate 5 Mg Tabs (Amlodipine besylate) .... Take 1 tablet by mouth once a day 13)  Walker With Chair and [employer]  .... Dx: alzheimers and seizure d/o, jonit pain 14)  Zoloft 100 Mg Tabs (Sertraline hcl) .... Take one tablet by mouth at bedtime 15)  Namenda Titration Pak 5 (28)-10 (21) Mg Tabs (Memantine hcl) 16)  Baclofen 10 Mg Tabs (Baclofen) .... Take one tablet by mouth three times a day 17)  Amoxicillin  875 Mg Tabs (Amoxicillin) .... Take 1 tablet by mouth two times a day for 14 days  Patient Instructions: 1)  Omnaris. 2 sprays in each nostril once a day.  Prescriptions: AMOXICILLIN 875 MG TABS (AMOXICILLIN) Take 1 tablet by mouth two times a day for 14 days  #28 x 0   Entered and Authorized by:   Nani Gasser MD   Signed by:   Nani Gasser MD on 11/05/2009   Method used:   Electronically to        [facility] [phone]* (retail)       [address]  [place], [place]  [address]       Ph: [phone]       Fax: [phone]   RxID:   [id number]

## 2010-09-07 NOTE — Letter (Signed)
Summary: [facility]  [facility]   Imported By: Lanelle Bal 03/29/2010 12:37:46  _____________________________________________________________________  External Attachment:    Type:   Image     Comment:   External Document

## 2010-09-07 NOTE — Assessment & Plan Note (Signed)
Summary: f/u on diabetes, urinary problems   Vital Signs:  Patient profile:   75 year old male Height:      67 inches Weight:      178 pounds Pulse rate:   76 / minute BP sitting:   109 / 57  (right arm) Cuff size:   regular  Vitals Entered By: Avon Gully CMA, Duncan Dull) (April 06, 2010 3:19 PM)  CC: f/u diabetes   Primary Care Burnett Lieber:  metheney, c  CC:  f/u diabetes.  History of Present Illness: Having difficulty urinating. having difficutly intiating. Weak stream.  No blood in th urine.  No fever. Occ back pain. Saw urology in the past and they wanted to do some invasive testing and was unable at that time.  His current episode of difficutly urinating starta about a week ago.  Also has noticed his sugars have been higher last couple of weeks. No othe changes in diet or medications.    Diabetes Management History:      The patient is a 75 years old male who comes in for evaluation of DM Type 2.  He has not been enrolled in the "Diabetic Education Program".  He states understanding of dietary principles and is following his diet appropriately.  He is checking home blood sugars.  He says that he is exercising.  Type of exercise includes: walking.        Hypoglycemic symptoms are not occurring.  No hyperglycemic symptoms are reported.        There are no symptoms to suggest diabetic complications.  No changes have been made to his treatment plan since last visit.    Current Medications (verified): 1)  Bayer Aspirin 325 Mg Tabs (Aspirin) .... Take 1 Tablet By Mouth Once A Day 2)  Lovaza 1 Gm Caps (Omega-3-Acid Ethyl Esters) .... 2 Caps Two Times A Day 3)  Metformin Hcl 500 Mg Tabs (Metformin Hcl) .... Take 2 Tablet By Mouth in The Am, and One in The Pm. 4)  Calcium 500 Mg Tabs (Calcium Carbonate) .... Take 1 Tablet By Mouth Once A Day 5)  Lisinopril 40 Mg Tabs (Lisinopril) .... Take 1 Tablet By Mouth Once A Day 6)  Glucometer He Has Is One Touch Ultra 2. .... Needs Lancet, Strips  To Test 2 X A Day. Dx:250.00 7)  Amlodipine Besylate 5 Mg Tabs (Amlodipine Besylate) .... Take 1 Tablet By Mouth Once A Day 8)  Walker With [employer] .... Dx: Alzheimers and Seizure D/o, [name] 9)  Zoloft 100 Mg Tabs (Sertraline Hcl) .... Take One Tablet By Mouth At Bedtime 10)  Cinnamon 500 Mg Tabs (Cinnamon) .... 1.000mg  At Lunch 11)  Multivitamins  Tabs (Multiple Vitamin) .... Take 1 Tablet By Mouth Once A Day 12)  Levothroid 25 Mcg Tabs (Levothyroxine Sodium) .... Take 1 Tablet By Mouth Once A Day About 1 Hour Before Breakfast. 13)  Lamictal 100 Mg Tabs (Lamotrigine) .... Take 100mg  in The Am and 200 Mg in The Pm  Allergies (verified): 1)  ! * Hctz 2)  ! Lipitor 3)  * Some Cholesterol Meds  Comments:  Nurse/Medical Assistant: The patient's medications and allergies were reviewed with the patient and were updated in the Medication and Allergy Lists. Avon Gully CMA, Duncan Dull) (April 06, 2010 3:24 PM)  Physical Exam  General:  Well-developed,well-nourished,in no acute distress; alert,appropriate and cooperative throughout examination Lungs:  Normal respiratory effort, chest expands symmetrically. Lungs are clear to auscultation, no crackles or wheezes. Heart:  Normal rate and regular rhythm. S1 and S2 normal without gallop, murmur, click, rub or other extra sounds. Extremities:  No LE edema.  Neurologic:  Unsteady gait. Using a cane.  Psych:  Cognition and judgment appear intact. Alert and cooperative with normal attention span and concentration. No apparent delusions, illusions, hallucinations  Diabetes Management Exam:    Foot Exam (with socks and/or shoes not present):       Sensory-Monofilament:          Left foot: normal          Right foot: normal   Impression & Recommendations:  Problem # 1:  DIABETES MELLITUS, TYPE II (ICD-250.00) Great A1C thought per his meter he has had sugar in teh 150-200s over teh last few weeks. Will check UA to rule out  infeciton since having urinary sxs. In meantime continue current regimen and f/u in 3 months. He is on an ACEi. Due for fasting lipids.  He was unable to give a urine specimen so will check microalbumin later.   Bring in home glucose monitor to test against ours to make sure accurate since his A1C is so good.  His updated medication list for this problem includes:    Bayer Aspirin 325 Mg Tabs (Aspirin) .Marland Kitchen... Take 1 tablet by mouth once a day    Metformin Hcl 500 Mg Tabs (Metformin hcl) .Marland Kitchen... Take 2 tablet by mouth in the am, and one in the pm.    Lisinopril 40 Mg Tabs (Lisinopril) .Marland Kitchen... Take 1 tablet by mouth once a day  Orders: Fingerstick (16109) Hemoglobin A1C (83036) T-Lipid Profile ([id number])  Problem # 2:  URINARY OBSTRUCTION UNSPECIFIED (ICD-599.60) Gave him AUA score sheet to take home and complete. If labs and UA are neg then will start flomax for sxs relief. If not helping after 2 weeks then recommend f/u with Urology for more invasive testing. Also over due for PSA.  Check to make sure not changes. I did not do a Prostat exam today.  Orders: T-PSA ([id number])  Problem # 3:  HYPOTHYROIDISM (ICD-244.9) Due to recheck.  His updated medication list for this problem includes:    Levothroid 25 Mcg Tabs (Levothyroxine sodium) .Marland Kitchen... Take 1 tablet by mouth once a day about 1 hour before breakfast.  Orders: T-TSH ([id number])  Problem # 4:  HYPERTENSION (ICD-401.9) BP here is low but at home getting SBPS in teh 150. Bring in cuff to check against our machine tomorrow. If BP still low tomorrow then consider stopping the amlodipine, esp if add flomax.  His updated medication list for this problem includes:    Lisinopril 40 Mg Tabs (Lisinopril) .Marland Kitchen... Take 1 tablet by mouth once a day    Amlodipine Besylate 5 Mg Tabs (Amlodipine besylate) .Marland Kitchen... Take 1 tablet by mouth once a day  Complete Medication List: 1)  Bayer Aspirin 325 Mg Tabs (Aspirin) .... Take 1 tablet by mouth once a  day 2)  Lovaza 1 Gm Caps (Omega-3-acid ethyl esters) .... 2 caps two times a day 3)  Metformin Hcl 500 Mg Tabs (Metformin hcl) .... Take 2 tablet by mouth in the am, and one in the pm. 4)  Calcium 500 Mg Tabs (Calcium carbonate) .... Take 1 tablet by mouth once a day 5)  Lisinopril 40 Mg Tabs (Lisinopril) .... Take 1 tablet by mouth once a day 6)  Glucometer He Has Is One Touch Ultra 2.  .... Needs lancet, strips to test 2 x a day. dx:250.00 7)  Amlodipine Besylate  5 Mg Tabs (Amlodipine besylate) .... Take 1 tablet by mouth once a day 8)  Walker With [employer]  .... Dx: alzheimers and seizure d/o, jonit pain 9)  Zoloft 100 Mg Tabs (Sertraline hcl) .... Take one tablet by mouth at bedtime 10)  Cinnamon 500 Mg Tabs (Cinnamon) .... 1.000mg  at lunch 11)  Multivitamins Tabs (Multiple vitamin) .... Take 1 tablet by mouth once a day 12)  Levothroid 25 Mcg Tabs (Levothyroxine sodium) .... Take 1 tablet by mouth once a day about 1 hour before breakfast. 13)  Lamictal 100 Mg Tabs (Lamotrigine) .... Take 100mg  in the am and 200 mg in the pm  Diabetes Management Assessment/Plan:      His blood pressure goal is < 130/80.    Patient Instructions: 1)  Nurse visit tomorrow.    Laboratory Results   Blood Tests   Date/Time Received: 04/06/10 Date/Time Reported: 04/06/10       Prevention & Chronic Care Immunizations   Influenza vaccine: Fluvax 3+  (05/26/2009)   Influenza vaccine due: 05/14/2008    Tetanus booster: 01/31/2008: Td   Tetanus booster due: 01/30/2018    Pneumococcal vaccine: Historical  (08/08/2005)   Pneumococcal vaccine due: None    H. zoster vaccine: Not documented  Colorectal Screening   Hemoccult: Not documented   Hemoccult due: Not Indicated    Colonoscopy: Done  (08/08/2004)   Colonoscopy due: 08/08/2014  Other Screening   PSA: 0.50  (04/07/2009)   PSA ordered.   PSA due due: 02/25/2009   Smoking status: quit  (05/31/2006)  Diabetes Mellitus    HgbA1C: 6.7  (05/26/2009)   Hemoglobin A1C due: 12/29/2008    Eye exam: normal  (12/08/2009)    Foot exam: yes  (04/06/2010)   High risk foot: Not documented   Foot care education: Not documented   Foot exam due: 09/09/2008    Urine microalbumin/creatinine ratio: 3.5  (09/11/2007)   Urine microalbumin/cr due: 09/10/2008  Lipids   Total Cholesterol: 166  (02/26/2008)   LDL: 80  (02/26/2008)   LDL Direct: Not documented   HDL: 39  (02/26/2008)   Triglycerides: 237  (02/26/2008)    SGOT (AST): 15  (04/07/2009)   SGPT (ALT): 14  (04/07/2009)   Alkaline phosphatase: 55  (04/07/2009)   Total bilirubin: 0.4  (04/07/2009)  Hypertension   Last Blood Pressure: 109 / 57  (04/06/2010)   Serum creatinine: 0.94  (12/02/2009)   Serum potassium 4.4  (12/02/2009)  Self-Management Support :    Diabetes self-management support: Not documented    Hypertension self-management support: Not documented    Lipid self-management support: Not documented

## 2010-09-07 NOTE — Assessment & Plan Note (Signed)
Summary: [facility]   Visit Type:  3 months follow up Primary Provider:  Linford Arnold, c  CC:  dizziness.  History of Present Illness: [name] is a very pleasant gentleman with a history of coronary disease.  He has had previous PCI of his LAD as well as his right coronary artery in 2008.  The right coronary artery was a PROMUS drug-eluting stent. Ejection fraction is preserved. Last Myoview in May of 2011 revealed an ejection fraction of 73% and normal perfusion.  I last saw him in April of 2011. At that time he was complaining of fatigue. A TSH was mildly elevated and Synthroid was added to his medical regimen by his primary care. Note his hemoglobin was normal. Since then the patient denies any dyspnea on exertion, orthopnea, PND, pedal edema, palpitations, syncope or chest pain.    Current Medications (verified): 1)  Bayer Aspirin 325 Mg Tabs (Aspirin) .... Take 1 Tablet By Mouth Once A Day 2)  Lovaza 1 Gm Caps (Omega-3-Acid Ethyl Esters) .... 2 Caps Two Times A Day 3)  Metformin Hcl 500 Mg Tabs (Metformin Hcl) .... Take 2 Tablet By Mouth in The Am, and One in The Pm. 4)  Calcium 500 Mg Tabs (Calcium Carbonate) .... Take 1 Tablet By Mouth Once A Day 5)  Lisinopril 40 Mg Tabs (Lisinopril) .... Take 1 Tablet By Mouth Once A Day 6)  Glucometer He Has Is One Touch Ultra 2. .... Needs Lancet, Strips To Test 2 X A Day. Dx:250.00 7)  Amlodipine Besylate 5 Mg Tabs (Amlodipine Besylate) .... Take 1 Tablet By Mouth Once A Day 8)  Walker With [employer] .... Dx: Alzheimers and Seizure D/o, [name] 9)  Zoloft 100 Mg Tabs (Sertraline Hcl) .... Take One Tablet By Mouth At Bedtime 10)  Cinnamon 500 Mg Tabs (Cinnamon) .... 1.000mg  At Lunch 11)  Multivitamins  Tabs (Multiple Vitamin) .... Take 1 Tablet By Mouth Once A Day 12)  Namenda 10 Mg Tabs (Memantine Hcl) .... Started Pack Two Times A Day 13)  Levothroid 25 Mcg Tabs (Levothyroxine Sodium) .... Take 1 Tablet By Mouth Once A Day  About 1 Hour Before Breakfast.  Allergies: 1)  ! * Hctz 2)  ! Lipitor 3)  * Some Cholesterol Meds  Past History:  Past Medical History: CAD (ICD-414.00)- PCI of his LAD as well as his right coronary artery.  HYPERLIPIDEMIA (ICD-272.4) HYPERTENSION (ICD-401.9) HYPOTHYROIDISM (ICD-244.9) HYPOGONADISM (ICD-257.2) DIABETES MELLITUS, TYPE II (ICD-250.00) DEPRESSION (ICD-311) MUSCLE WEAKNESS (GENERALIZED) (ICD-728.87) CLOSED FRACTURE METACARPAL BONE SITE UNSPECIFIED (ICD-815.00) SEIZURE DISORDER (ICD-780.39) BAKER'S CYST, LEFT KNEE (ICD-727.51) ERECTILE DYSFUNCTION (ICD-302.72) OBSTRUCTIVE SLEEP APNEA (ICD-327.23) ALZHEIMER'S DISEASE, MODERATE (ICD-331.0)  Past Surgical History: Reviewed history from 03/18/2009 and no changes required. Cholecystectomy,  PCI right coronary artery and left anterior descending  Social History: Reviewed history from 03/07/2009 and no changes required. 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  Married to [place] with 2 adult children.  Quit smoking 1990, no EtOH, no drugs, no caffeine, walks for 46-60 min daily.  Review of Systems       Difficulty ambulating secondary to generalized weakness and gait instability as well as decreased memory/dementia but no fevers or chills, productive cough, hemoptysis, dysphasia, odynophagia, melena, hematochezia, dysuria, hematuria, rash, seizure activity, orthopnea, PND, pedal edema, claudication. Remaining systems are negative.   Vital Signs:  Patient profile:   75 year old male Height:      67 inches Weight:      180.25 pounds  BMI:     28.33 Pulse rate:   70 / minute Resp:     18 per minute BP sitting:   130 / 50  (left arm) Cuff size:   large  Vitals Entered By: Vikki Ports (February 17, 2010 10:28 AM)  Physical Exam  General:  Well-developed well-nourished in no acute distress.  Skin is warm and dry.  HEENT is normal.  Neck is supple. No thyromegaly.  Chest is clear to auscultation  with normal expansion.  Cardiovascular exam is regular rate and rhythm.  Abdominal exam nontender or distended. No masses palpated. Extremities show no edema. neuro grossly intact    Impression & Recommendations:  Problem # 1:  CAD (ICD-414.00) No chest pain. Last Myoview normal. Continue medical therapy with aspirin and ACE inhibitor. No statin as he had not tolerated this in the past. His updated medication list for this problem includes:    Bayer Aspirin 325 Mg Tabs (Aspirin) .Marland Kitchen... Take 1 tablet by mouth once a day    Lisinopril 40 Mg Tabs (Lisinopril) .Marland Kitchen... Take 1 tablet by mouth once a day    Amlodipine Besylate 5 Mg Tabs (Amlodipine besylate) .Marland Kitchen... Take 1 tablet by mouth once a day  Problem # 2:  HYPERLIPIDEMIA (ICD-272.4) Continue present medications. Patient intolerant to statins. His updated medication list for this problem includes:    Lovaza 1 Gm Caps (Omega-3-acid ethyl esters) .Marland Kitchen... 2 caps two times a day  Problem # 3:  HYPERTENSION (ICD-401.9) Blood pressure controlled on present medications. Will continue. His updated medication list for this problem includes:    Bayer Aspirin 325 Mg Tabs (Aspirin) .Marland Kitchen... Take 1 tablet by mouth once a day    Lisinopril 40 Mg Tabs (Lisinopril) .Marland Kitchen... Take 1 tablet by mouth once a day    Amlodipine Besylate 5 Mg Tabs (Amlodipine besylate) .Marland Kitchen... Take 1 tablet by mouth once a day  Problem # 4:  DEMENTIA (ICD-294.8) Being evaluated by neurology.  Problem # 5:  HYPOTHYROIDISM (ICD-244.9)  His updated medication list for this problem includes:    Levothroid 25 Mcg Tabs (Levothyroxine sodium) .Marland Kitchen... Take 1 tablet by mouth once a day about 1 hour before breakfast.  Problem # 6:  DIABETES MELLITUS, TYPE II (ICD-250.00) Management per primary care. His updated medication list for this problem includes:    Bayer Aspirin 325 Mg Tabs (Aspirin) .Marland Kitchen... Take 1 tablet by mouth once a day    Metformin Hcl 500 Mg Tabs (Metformin hcl) .Marland Kitchen... Take 2  tablet by mouth in the am, and one in the pm.    Lisinopril 40 Mg Tabs (Lisinopril) .Marland Kitchen... Take 1 tablet by mouth once a day  Problem # 7:  OBSTRUCTIVE SLEEP APNEA (ICD-327.23)

## 2010-09-07 NOTE — Letter (Signed)
Summary: [facility]   Imported By: Lanelle Bal 12/25/2009 11:57:29  _____________________________________________________________________  External Attachment:    Type:   Image     Comment:   External Document

## 2010-09-09 NOTE — Assessment & Plan Note (Signed)
Summary: [name] and hurt is arm    Vital Signs:  Patient profile:   75 year old male Height:      67 inches Weight:      173 pounds Pulse rate:   90 / minute BP sitting:   140 / 77  (right arm) Cuff size:   regular  Vitals Entered By: Avon Gully CMA, Duncan Dull) (August 31, 2010 2:17 PM) CC: fell yesterday and scraped left arm   Primary Care Provider:  metheney, c  CC:  fell yesterday and scraped left arm.  History of Present Illness: fell yesterday and scraped left arm. He says he was sitting on the bed trying to get out and he fell.  He hit his left outer arm and elbow on a dresser.  It bled pretty heavily it was very superficial ulcers but did not bring them in for sutures at that time.  There is no active drainage. denies pain.  He is not to take any pain medication for it.  They have not been using any ointments or creams etc. on area.  Current Medications (verified): 1)  Bayer Aspirin 325 Mg Tabs (Aspirin) .... Take 1 Tablet By Mouth Once A Day 2)  Lovaza 1 Gm Caps (Omega-3-Acid Ethyl Esters) .... 2 Caps Two Times A Day 3)  Metformin Hcl 1000 Mg Tabs (Metformin Hcl) .... Take 1 Tablet By Mouth Two Times A Day 4)  Calcium 500 Mg Tabs (Calcium Carbonate) .... Take 1 Tablet By Mouth Once A Day 5)  Glucometer He Has Is One Touch Ultra 2. .... Needs Lancet, Strips To Test 2 X A Day. Dx:250.00 6)  Walker With [employer] .... Dx: Alzheimers and Seizure D/o, [name] 7)  Zoloft 100 Mg Tabs (Sertraline Hcl) .... Take One Tablet By Mouth At Bedtime 8)  Multivitamins  Tabs (Multiple Vitamin) .... Take 1 Tablet By Mouth Once A Day 9)  Levothroid 75 Mcg Tabs (Levothyroxine Sodium) .... Take 1 Tablet By Mouth Once A Day 10)  Lamictal 100 Mg Tabs (Lamotrigine) .... 200mg  Two Times A Day 11)  Niaspan 500 Mg Cr-Tabs (Niacin (Antihyperlipidemic)) .... Take 1 Tablet By Mouth Once A Day 12)  Tamsulosin Hcl 0.4 Mg Caps (Tamsulosin Hcl) .... 2 Tablets At Bedtime  Allergies  (verified): 1)  ! * Hctz 2)  ! Lipitor 3)  * Some Cholesterol Meds  Comments:  Nurse/Medical Assistant: The patient's medications and allergies were reviewed with the patient and were updated in the Medication and Allergy Lists. Avon Gully CMA, Duncan Dull) (August 31, 2010 2:21 PM)  Physical Exam  Msk:  held has normal range of motion and left wrist and hand with normal range of motion Skin:  left upper arm near the elbow with a very superficial laceration of the skin.  There are 3 pieces of skin that are completely rolled back the area of exposed underneath his approximately 5 cm round.  He has a smaller lesion right over the edge of the lateral epicondyles with skin is denuded as well and it is approximately 1 cm.  The wounds look very clean no drainage.  The edges of the skin flaps were approximated and I used Dermabond to reattach the skin flaps.  A really doesn't think that this injury he would do well with suturing.  I explained that the skin flaps may not survive but at least will help protect the skin underneath while he heals.   Impression & Recommendations:  Problem # 1:  WOUND,  OPEN, ELBOW (ICD-881.01) patient tolerated the procedure well.  Patient given to keep the wound covered.  For the smaller excoriation on his elbowrec  use Vaseline and keep it covered as well.  He is to call if he has any problems any redness or drainage.  Complete Medication List: 1)  Bayer Aspirin 325 Mg Tabs (Aspirin) .... Take 1 tablet by mouth once a day 2)  Lovaza 1 Gm Caps (Omega-3-acid ethyl esters) .... 2 caps two times a day 3)  Metformin Hcl 1000 Mg Tabs (Metformin hcl) .... Take 1 tablet by mouth two times a day 4)  Calcium 500 Mg Tabs (Calcium carbonate) .... Take 1 tablet by mouth once a day 5)  Glucometer He Has Is One Touch Ultra 2.  .... Needs lancet, strips to test 2 x a day. 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 Dx: alzheimers and seizure d/o, jonit pain 7)  Zoloft  100 Mg Tabs (Sertraline hcl) .... Take one tablet by mouth at bedtime 8)  Multivitamins Tabs (Multiple vitamin) .... Take 1 tablet by mouth once a day 9)  Levothroid 75 Mcg Tabs (Levothyroxine sodium) .... Take 1 tablet by mouth once a day 10)  Lamictal 100 Mg Tabs (Lamotrigine) .... 200mg  two times a day 11)  Niaspan 500 Mg Cr-tabs (Niacin (antihyperlipidemic)) .... Take 1 tablet by mouth once a day 12)  Tamsulosin Hcl 0.4 Mg Caps (Tamsulosin hcl) .... 2 tablets at bedtime 13)  Walker Without Wheels.  .... Dx: recurrent falls and muscle weakness, seizure disorder Prescriptions: WALKER WITHOUT WHEELS. DX: Recurrent falls and muscle weakness, seizure disorder  #1 x 0   Entered and Authorized by:   Nani Gasser MD   Signed by:   Nani Gasser MD on 08/31/2010   Method used:   Print then Give to Patient   RxID:   [id number]    Orders Added: 1)  Est. Patient Level IV [21308]

## 2010-09-09 NOTE — Assessment & Plan Note (Signed)
Summary: [facility]   Visit Type:  Follow-up Primary Provider:  metheney, c  CC:  Bilateral arm being heavy.  History of Present Illness: [name] is a very pleasant gentleman with a history of coronary disease.  He has had previous PCI of his LAD as well as his right coronary artery in 2008.  The right coronary artery was a PROMUS drug-eluting stent. Ejection fraction is preserved. Last Myoview in May of 2011 revealed an ejection fraction of 73% and normal perfusion.  I last saw him in July of 2011. Since then, there is no dyspnea. On New Year's Eve his wife noted that he was very diaphoretic. He was taken to [facility] where apparently the evaluation was negative. I do not have those records available. This past weekend he complained of bilateral arm heaviness. Note he has significant dementia and does not recall the details. There was no chest pain.  Current Medications (verified): 1)  Bayer Aspirin 325 Mg Tabs (Aspirin) .... Take 1 Tablet By Mouth Once A Day 2)  Lovaza 1 Gm Caps (Omega-3-Acid Ethyl Esters) .... 2 Caps Two Times A Day 3)  Metformin Hcl 1000 Mg Tabs (Metformin Hcl) .... Take 1 Tablet By Mouth Two Times A Day 4)  Calcium 500 Mg Tabs (Calcium Carbonate) .... Take 1 Tablet By Mouth Once A Day 5)  Glucometer He Has Is One Touch Ultra 2. .... Needs Lancet, Strips To Test 2 X A Day. Dx:250.00 6)  Walker With [employer] .... Dx: Alzheimers and Seizure D/o, [name] 7)  Zoloft 100 Mg Tabs (Sertraline Hcl) .... Take One Tablet By Mouth At Bedtime 8)  Multivitamins  Tabs (Multiple Vitamin) .... Take 1 Tablet By Mouth Once A Day 9)  Levothroid 75 Mcg Tabs (Levothyroxine Sodium) .... Take 1 Tablet By Mouth Once A Day 10)  Lamictal 100 Mg Tabs (Lamotrigine) .... 200mg  Two Times A Day 11)  Niaspan 500 Mg Cr-Tabs (Niacin (Antihyperlipidemic)) .... Take 1 Tablet By Mouth Once A Day 12)  Tamsulosin Hcl 0.4 Mg Caps (Tamsulosin Hcl) .... 2 Tablets At  Bedtime  Allergies: 1)  ! * Hctz 2)  ! Lipitor 3)  * Some Cholesterol Meds  Past History:  Past Medical History: CAD (ICD-414.00)- PCI of his LAD as well as his right coronary artery.  hypertension Hyperlipidemia Diabetes mellitus Hypothyroidism Obstructive sleep apnea dementia Seizure disorder  Past Surgical History: Reviewed history from 05/25/2010 and no changes required. Cholecystectomy,  PCI right coronary artery and left anterior descending Cervical spine decompression.   Social History: Reviewed history from 03/07/2009 and no changes required. 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  Married to Pinnacle with 2 adult children.  Quit smoking 1990, no EtOH, no drugs, no caffeine, walks for 46-60 min daily.  Review of Systems       no fevers or chills, productive cough, hemoptysis, dysphasia, odynophagia, melena, hematochezia, dysuria, hematuria, rash, seizure activity, orthopnea, PND, pedal edema, claudication. Remaining systems are negative.   Vital Signs:  Patient profile:   75 year old male Height:      67 inches Weight:      166.75 pounds BMI:     26.21 Pulse rate:   82 / minute Pulse rhythm:   regular Resp:     18 per minute BP sitting:   104 / 62  (left arm) Cuff size:   large  Vitals Entered By: Vikki Ports (August 18, 2010 10:56 AM)  Physical Exam  General:  Well-developed well-nourished in no acute distress.  Skin is warm and dry.  HEENT is normal.  Neck is supple. No thyromegaly.  Chest is clear to auscultation with normal expansion.  Cardiovascular exam is regular rate and rhythm.  Abdominal exam nontender or distended. No masses palpated. Extremities show no edema. neuro grossly intact    EKG  Procedure date:  08/18/2010  Findings:      Sinus rhythm with nonspecific ST changes.  Impression & Recommendations:  Problem # 1:  CHEST PAIN UNSPECIFIED (ICD-786.50) Patient with arm heaviness. Electrocardiogram was  negative. Plan lexiscan Myoview. The following medications were removed from the medication list:    Lisinopril 40 Mg Tabs (Lisinopril) .Marland Kitchen... Take 1 tablet by mouth once a day His updated medication list for this problem includes:    Bayer Aspirin 325 Mg Tabs (Aspirin) .Marland Kitchen... Take 1 tablet by mouth once a day  Orders: Nuclear Stress Test (Nuc Stress Test)  Problem # 2:  CAD (ICD-414.00) Continued aspirin. Patient intolerant to statins. The following medications were removed from the medication list:    Lisinopril 40 Mg Tabs (Lisinopril) .Marland Kitchen... Take 1 tablet by mouth once a day His updated medication list for this problem includes:    Bayer Aspirin 325 Mg Tabs (Aspirin) .Marland Kitchen... Take 1 tablet by mouth once a day  Problem # 3:  HYPERLIPIDEMIA (ICD-272.4) Continue present medications. Lipids and liver monitored by primary care. His updated medication list for this problem includes:    Lovaza 1 Gm Caps (Omega-3-acid ethyl esters) .Marland Kitchen... 2 caps two times a day    Niaspan 500 Mg Cr-tabs (Niacin (antihyperlipidemic)) .Marland Kitchen... Take 1 tablet by mouth once a day  Problem # 4:  HYPERTENSION (ICD-401.9) Blood pressure normal on no medications. The following medications were removed from the medication list:    Lisinopril 40 Mg Tabs (Lisinopril) .Marland Kitchen... Take 1 tablet by mouth once a day His updated medication list for this problem includes:    Bayer Aspirin 325 Mg Tabs (Aspirin) .Marland Kitchen... Take 1 tablet by mouth once a day  Problem # 5:  HYPOTHYROIDISM (ICD-244.9)  His updated medication list for this problem includes:    Levothroid 75 Mcg Tabs (Levothyroxine sodium) .Marland Kitchen... Take 1 tablet by mouth once a day  Problem # 6:  DIABETES MELLITUS, TYPE II (ICD-250.00)  The following medications were removed from the medication list:    Lisinopril 40 Mg Tabs (Lisinopril) .Marland Kitchen... Take 1 tablet by mouth once a day His updated medication list for this problem includes:    Bayer Aspirin 325 Mg Tabs (Aspirin) .Marland Kitchen... Take 1  tablet by mouth once a day    Metformin Hcl 1000 Mg Tabs (Metformin hcl) .Marland Kitchen... Take 1 tablet by mouth two times a day  Problem # 7:  ALZHEIMER'S DISEASE, MODERATE (ICD-331.0)  Patient Instructions: 1)  Your physician recommends that you schedule a follow-up appointment in: 8 WEEKS 2)  Your physician has requested that you have an LEXISCAN stress myoview.  For further information please visit [email or web address]  Please follow instruction sheet, as given.

## 2010-09-09 NOTE — Progress Notes (Signed)
Summary: Nuclear Pre-Procedure  Phone Note Outgoing Call Call back at [facility] Phone [phone]   Call placed by: Stanton Kidney, EMT-P,  August 31, 2010 2:01 PM Action Taken: Phone Call Completed Summary of Call: Left message with information on Myoview Information Sheet (see scanned document for details). Stanton Kidney, EMT-P  August 31, 2010 2:01 PM     Nuclear Med Background Indications for Stress Test: Evaluation for Ischemia, Stent Patency, PTCA Patency   History: Angioplasty, Echo, Heart Catheterization, Myocardial Perfusion Study, Stents  History Comments: 4/08 Echo: EF= 65% 4/08 MPS: EF=68%, mild/mod. ischemia-inferior 7/08 Heart Cath: EF=60%, RCA-95%, LAD-90%, res. CFX-60% > Stents: RCA, LAD 5/11 MPS: NL, EF= 71%  Symptoms: Diaphoresis  Symptoms Comments: Bilat arm heaviness   Nuclear Pre-Procedure Cardiac Risk Factors: History of Smoking, Hypertension, Lipids, NIDDM Height (in): 67  Nuclear Med Study Referring MD:  B.Crenshaw

## 2010-09-09 NOTE — Assessment & Plan Note (Signed)
Summary: Cardiology Nuclear Testing  Nuclear Med Background Indications for Stress Test: Evaluation for Ischemia, Stent Patency, PTCA Patency   History: Angioplasty, Echo, Heart Catheterization, Myocardial Perfusion Study, Stents  History Comments: 4/08 Echo:EF= 65%; 4/08 MPS:EF=68%, mild-moderate inferior ischemia;  7/08 Cath:Stents-RCA,LAD, EF=60%; 5/11 JXB:JYNWGN, EF= 71%  Symptoms: Diaphoresis  Symptoms Comments: (B) arm heaviness   Nuclear Pre-Procedure Cardiac Risk Factors: History of Smoking, Hypertension, Lipids, NIDDM Caffeine/Decaff Intake: none NPO After: 9:00 PM Lungs: Clear.  O2 Sat 99% on RA. IV 0.9% NS with Angio Cath: 20g     IV Site: R Wrist IV Started by: Stanton Kidney, EMT-P Chest Size (in) 42     Height (in): 67 Weight (lb): 171 BMI: 26.88 Tech Comments: CBG=130 @ 8:21 am, per patient.  Nuclear Med Study 1 or 2 day study:  1 day     Stress Test Type:  Eugenie Birks Reading MD:  Cassell Clement, MD     Referring MD:  Olga Millers, MD Resting Radionuclide:  Technetium 25m Tetrofosmin     Resting Radionuclide Dose:  11 mCi  Stress Radionuclide:  Technetium 73m Tetrofosmin     Stress Radionuclide Dose:  33 mCi   Stress Protocol   Lexiscan: 0.4 mg   Stress Test Technologist:  Rea College, CMA-N     Nuclear Technologist:  Doyne Keel, CNMT  Rest Procedure  Myocardial perfusion imaging was performed at rest 45 minutes following the intravenous administration of Technetium 44m Tetrofosmin.  Stress Procedure  The patient received IV Lexiscan 0.4 mg over 15-seconds.  Technetium 53m Tetrofosmin injected at 30-seconds.  There were no significant changes with infusion.  Quantitative spect images were obtained after a 45 minute delay.  QPS Raw Data Images:  Normal; no motion artifact; normal heart/lung ratio. Stress Images:  Normal homogeneous uptake in all areas of the myocardium. Rest Images:  Normal homogeneous uptake in all areas of the  myocardium. Subtraction (SDS):  No evidence of ischemia. Transient Ischemic Dilatation:  .96  (Normal <1.22)  Lung/Heart Ratio:  .25  (Normal <0.45)  Quantitative Gated Spect Images QGS EDV:  56 ml QGS ESV:  14 ml QGS EF:  75 %  Findings Normal nuclear study      Overall Impression  Exercise Capacity: Lexiscan with no exercise. BP Response: Normal blood pressure response. Clinical Symptoms: No chest pain ECG Impression: No significant ST segment change suggestive of ischemia. Overall Impression: Normal stress nuclear study.  No wall motion abnormalities.  No ischemia.  Appended Document: Cardiology Nuclear Testing ok  Appended Document: Cardiology Nuclear Testing pt. aware of results.

## 2010-09-17 ENCOUNTER — Ambulatory Visit (INDEPENDENT_AMBULATORY_CARE_PROVIDER_SITE_OTHER): Payer: Self-pay | Admitting: Family Medicine

## 2010-09-17 ENCOUNTER — Encounter: Payer: Self-pay | Admitting: Family Medicine

## 2010-09-17 DIAGNOSIS — L03211 Cellulitis of face: Secondary | ICD-10-CM

## 2010-09-17 DIAGNOSIS — L0201 Cutaneous abscess of face: Secondary | ICD-10-CM | POA: Insufficient documentation

## 2010-09-23 NOTE — Assessment & Plan Note (Signed)
Summary: Abscess on right cheek   Vital Signs:  Patient profile:   75 year old male Height:      67 inches Weight:      174 pounds Pulse rate:   71 / minute BP sitting:   137 / 71  (right arm) Cuff size:   regular  Vitals Entered By: Avon Gully CMA, Duncan Dull) (September 17, 2010 2:28 PM) CC: sore on cheek x 2 days   Primary Care Provider:  Briceson Broadwater, c  CC:  sore on cheek x 2 days.  History of Present Illness: noticed lesion 2 days ago. Has been getting larger and more red. Has been picking at it with tweezers. denies andy trauma. Looked initially like a pimple. No fever. Denies any drainage or pus.   Current Medications (verified): 1)  Bayer Aspirin 325 Mg Tabs (Aspirin) .... Take 1 Tablet By Mouth Once A Day 2)  Lovaza 1 Gm Caps (Omega-3-Acid Ethyl Esters) .... 2 Caps Two Times A Day 3)  Metformin Hcl 1000 Mg Tabs (Metformin Hcl) .... Take 1 Tablet By Mouth Two Times A Day 4)  Calcium 500 Mg Tabs (Calcium Carbonate) .... Take 1 Tablet By Mouth Once A Day 5)  Glucometer He Has Is One Touch Ultra 2. .... Needs Lancet, Strips To Test 2 X A Day. Dx:250.00 6)  Walker With [employer] .... Dx: Alzheimers and Seizure D/o, [name] 7)  Zoloft 100 Mg Tabs (Sertraline Hcl) .... Take One Tablet By Mouth At Bedtime 8)  Multivitamins  Tabs (Multiple Vitamin) .... Take 1 Tablet By Mouth Once A Day 9)  Levothroid 75 Mcg Tabs (Levothyroxine Sodium) .... Take 1 Tablet By Mouth Once A Day 10)  Lamictal 100 Mg Tabs (Lamotrigine) .... 200mg  Two Times A Day 11)  Niaspan 500 Mg Cr-Tabs (Niacin (Antihyperlipidemic)) .... Take 1 Tablet By Mouth Once A Day 12)  Tamsulosin Hcl 0.4 Mg Caps (Tamsulosin Hcl) .... 2 Tablets At Bedtime 13)  Walker Without Wheels. .... Dx: Recurrent Falls and Muscle Weakness, Seizure Disorder 14)  Aricept Odt 5 Mg Tbdp (Donepezil Hcl) .... Take One Tablet Once A Day  Allergies (verified): 1)  ! * Hctz 2)  ! Lipitor 3)  * Some Cholesterol  Meds  Comments:  Nurse/Medical Assistant: The patient's medications and allergies were reviewed with the patient and were updated in the Medication and Allergy Lists. Avon Gully CMA, Duncan Dull) (September 17, 2010 2:29 PM)  Physical Exam  General:  Well-developed,well-nourished,in no acute distress; alert,appropriate and cooperative throughout examination Skin:  Right side of face over the cheek has a 4 x 6 cm erythematou area with a indurated center of approx 1 x 1.5 cm with a scab in the center.     Impression & Recommendations:  Problem # 1:  ABSCESS, FACE (ICD-682.0) There is a small amt of induration in the center but I don't think amenable to incision and drainge. Will start Antibiotic today. If gets worse needs to go to UC over the weekend for I & D or if indurated area still present by [facility] or Tues then f/u for I & D Warm compresses.  Avoid picking at it.  His updated medication list for this problem includes:    Bactrim Ds 800-160 Mg Tabs (Sulfamethoxazole-trimethoprim) .Marland Kitchen... Take 1 tablet by mouth two times a day for 10 days.  Complete Medication List: 1)  Bayer Aspirin 325 Mg Tabs (Aspirin) .... Take 1 tablet by mouth once a day 2)  Lovaza 1 Gm Caps (  Omega-3-acid ethyl esters) .... 2 caps two times a day 3)  Metformin Hcl 1000 Mg Tabs (Metformin hcl) .... Take 1 tablet by mouth two times a day 4)  Calcium 500 Mg Tabs (Calcium carbonate) .... Take 1 tablet by mouth once a day 5)  Glucometer He Has Is One Touch Ultra 2.  .... Needs lancet, strips to test 2 x a day. 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 Dx: alzheimers and seizure d/o, jonit pain 7)  Zoloft 100 Mg Tabs (Sertraline hcl) .... Take one tablet by mouth at bedtime 8)  Multivitamins Tabs (Multiple vitamin) .... Take 1 tablet by mouth once a day 9)  Levothroid 75 Mcg Tabs (Levothyroxine sodium) .... Take 1 tablet by mouth once a day 10)  Lamictal 100 Mg Tabs (Lamotrigine) .... 200mg  two times a  day 11)  Niaspan 500 Mg Cr-tabs (Niacin (antihyperlipidemic)) .... Take 1 tablet by mouth once a day 12)  Tamsulosin Hcl 0.4 Mg Caps (Tamsulosin hcl) .... 2 tablets at bedtime 13)  Walker Without Wheels.  .... Dx: recurrent falls and muscle weakness, seizure disorder 14)  Aricept Odt 5 Mg Tbdp (Donepezil hcl) .... Take one tablet once a day 15)  Bactrim Ds 800-160 Mg Tabs (Sulfamethoxazole-trimethoprim) .... Take 1 tablet by mouth two times a day for 10 days.  Patient Instructions: 1)  Complete the antibiotic 2)  Wash face with antibacterial soap.   3)  No picking at it.  4)  Folow up next week if not better.  Prescriptions: BACTRIM DS 800-160 MG TABS (SULFAMETHOXAZOLE-TRIMETHOPRIM) Take 1 tablet by mouth two times a day for 10 days.  #20 x 0   Entered and Authorized by:   Nani Gasser MD   Signed by:   Nani Gasser MD on 09/17/2010   Method used:   Electronically to        [facility] [phone]* (retail)       [address] [place], [place]  [address]       Ph: [phone]       Fax: [phone]   RxID:   [id number]    Orders Added: 1)  Est. Patient Level IV [64403]

## 2010-10-20 ENCOUNTER — Encounter: Payer: Self-pay | Admitting: Cardiology

## 2010-10-20 ENCOUNTER — Ambulatory Visit (INDEPENDENT_AMBULATORY_CARE_PROVIDER_SITE_OTHER): Payer: PRIVATE HEALTH INSURANCE | Admitting: Cardiology

## 2010-10-20 DIAGNOSIS — I2510 Atherosclerotic heart disease of native coronary artery without angina pectoris: Secondary | ICD-10-CM

## 2010-10-20 DIAGNOSIS — E7800 Pure hypercholesterolemia, unspecified: Secondary | ICD-10-CM

## 2010-10-26 NOTE — Assessment & Plan Note (Signed)
Summary: F2M/DM/TT   Visit Type:  Follow-up Primary Provider:  metheney, c  CC:  no complaints.  History of Present Illness: [name] is a very pleasant gentleman with a history of coronary disease.  He has had previous PCI of his LAD as well as his right coronary artery in 2008.  The right coronary artery was a PROMUS drug-eluting stent. I last saw him in Jan 2012. He had had diaphoresis and arm pain; myoview performed Jan 2012 which revealed EF 75 and normal perfusion. Since then, the patient denies any dyspnea on exertion, orthopnea, PND, pedal edema, palpitations, syncope or chest pain.    Current Medications (verified): 1)  Bayer Aspirin 325 Mg Tabs (Aspirin) .... Take 1 Tablet By Mouth Once A Day 2)  Ra Krill Oil  Caps (Krill Oil) .... Take 1 Capsule By Mouth Once A Day 3)  Metformin Hcl 1000 Mg Tabs (Metformin Hcl) .... Take 1 Tablet By Mouth Two Times A Day 4)  Glucometer He Has Is One Touch Ultra 2. .... Needs Lancet, Strips To Test 2 X A Day. 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 Dx: Alzheimers and Seizure D/o, [name] 6)  Zoloft 100 Mg Tabs (Sertraline Hcl) .... Take One Tablet By Mouth At Bedtime 7)  Multivitamins  Tabs (Multiple Vitamin) .... Take 1 Tablet By Mouth Once A Day 8)  Levothroid 75 Mcg Tabs (Levothyroxine Sodium) .... Take 1 Tablet By Mouth Once A Day 9)  Lamictal 100 Mg Tabs (Lamotrigine) .... 200mg  Two Times A Day 10)  Niaspan 500 Mg Cr-Tabs (Niacin (Antihyperlipidemic)) .... Take 1 Tablet By Mouth Once A Day 11)  Tamsulosin Hcl 0.4 Mg Caps (Tamsulosin Hcl) .... 2 Tablets At Bedtime 12)  Namenda Titration Pak 5 (28)-10 (21) Mg Tabs (Memantine Hcl) .... As Directed 13)  Fiber Laxative 625 Mg Tabs (Calcium Polycarbophil) .... 2 Tablets At Bedtime  Allergies: 1)  ! * Hctz 2)  ! Lipitor 3)  ! * Aricept 4)  * Some Cholesterol Meds  Past History:  Past Medical History: Reviewed history from 08/18/2010 and no changes required. CAD  (ICD-414.00)- PCI of his LAD as well as his right coronary artery.  hypertension Hyperlipidemia Diabetes mellitus Hypothyroidism Obstructive sleep apnea dementia Seizure disorder  Past Surgical History: Reviewed history from 05/25/2010 and no changes required. Cholecystectomy,  PCI right coronary artery and left anterior descending Cervical spine decompression.   Social History: Reviewed history from 03/07/2009 and no changes required. 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  Married to [name] with 2 adult children.  Quit smoking 1990, no EtOH, no drugs, no caffeine, walks for 46-60 min daily.  Review of Systems       no fevers or chills, productive cough, hemoptysis, dysphasia, odynophagia, melena, hematochezia, dysuria, hematuria, rash, seizure activity, orthopnea, PND, pedal edema, claudication. Remaining systems are negative.   Vital Signs:  Patient profile:   75 year old male Height:      67 inches Weight:      167 pounds BMI:     26.25 Pulse rate:   80 / minute Pulse rhythm:   regular Resp:     18 per minute BP sitting:   100 / 58  (right arm) Cuff size:   regular  Vitals Entered By: Vikki Ports (October 20, 2010 12:00 PM)  Physical Exam  General:  Well-developed well-nourished in no acute distress.  Skin is warm and dry.  HEENT is normal.  Neck is supple. No  thyromegaly.  Chest is clear to auscultation with normal expansion.  Cardiovascular exam is regular rate and rhythm.  Abdominal exam nontender or distended. No masses palpated. Extremities show no edema. neuro grossly intact    Impression & Recommendations:  Problem # 1:  CHEST PAIN UNSPECIFIED (ICD-786.50) Patient has had no further episodes of arm discomfort or diaphoresis. Myoview negative. No further evaluation at this time. His updated medication list for this problem includes:    Bayer Aspirin 325 Mg Tabs (Aspirin) .Marland Kitchen... Take 1 tablet by mouth once a day  Problem # 2:  CAD  (ICD-414.00) Continue aspirin. Intolerant to statins. His updated medication list for this problem includes:    Bayer Aspirin 325 Mg Tabs (Aspirin) .Marland Kitchen... Take 1 tablet by mouth once a day  Problem # 3:  HYPERLIPIDEMIA (ICD-272.4) Intolerant to statins. His updated medication list for this problem includes:    Niaspan 500 Mg Cr-tabs (Niacin (antihyperlipidemic)) .Marland Kitchen... Take 1 tablet by mouth once a day  Problem # 4:  HYPERTENSION (ICD-401.9)  His updated medication list for this problem includes:    Bayer Aspirin 325 Mg Tabs (Aspirin) .Marland Kitchen... Take 1 tablet by mouth once a day  Problem # 5:  HYPOTHYROIDISM (ICD-244.9)  His updated medication list for this problem includes:    Levothroid 75 Mcg Tabs (Levothyroxine sodium) .Marland Kitchen... Take 1 tablet by mouth once a day  Problem # 6:  DIABETES MELLITUS, TYPE II (ICD-250.00)  His updated medication list for this problem includes:    Bayer Aspirin 325 Mg Tabs (Aspirin) .Marland Kitchen... Take 1 tablet by mouth once a day    Metformin Hcl 1000 Mg Tabs (Metformin hcl) .Marland Kitchen... Take 1 tablet by mouth two times a day  Problem # 7:  ALZHEIMER'S DISEASE, MODERATE (ICD-331.0)  Patient Instructions: 1)  Your physician wants you to follow-up in: 6 months  You will receive a reminder letter in the mail two months in advance. If you don't receive a letter, please call our office to schedule the follow-up appointment.

## 2010-11-16 LAB — BASIC METABOLIC PANEL
BUN: 22 mg/dL (ref 6–23)
CO2: 25 mEq/L (ref 19–32)
Chloride: 89 mEq/L — ABNORMAL LOW (ref 96–112)
Creatinine, Ser: 1.3 mg/dL (ref 0.4–1.5)
GFR calc Af Amer: >60 mL/min (ref >60)
Glucose, Bld: 155 mg/dL — ABNORMAL HIGH (ref 70–99)
Potassium: 5 mEq/L (ref 3.5–5.1)
Sodium: 125 mEq/L — ABNORMAL LOW (ref 135–145)

## 2010-11-16 LAB — DIFFERENTIAL
Eosinophils Absolute: 0.2 10*3/uL (ref 0.0–0.7)
Lymphocytes Relative: 10 % — ABNORMAL LOW (ref 12–46)
Lymphs Abs: 0.8 10*3/uL (ref 0.7–4.0)
Neutro Abs: 5.7 10*3/uL (ref 1.7–7.7)
Neutrophils Relative %: 68 % (ref 43–77)

## 2010-11-16 LAB — CBC
HCT: 34.7 % — ABNORMAL LOW (ref 39.0–52.0)
MCHC: 35 g/dL (ref 30.0–36.0)
MCV: 87.6 fL (ref 78.0–100.0)
Platelets: 258 10*3/uL (ref 150–400)
WBC: 8.4 10*3/uL (ref 4.0–10.5)

## 2010-12-17 ENCOUNTER — Telehealth: Payer: Self-pay | Admitting: Family Medicine

## 2010-12-17 NOTE — Telephone Encounter (Addendum)
Pt walked in to the UC with wife and stating dizzy, and asked for B/P to be checked.  99/58.  Pt agitated and left when his wife tried to tell the UC staff he had complained of dizziness.  Left UC and walked in to the office he see's his PCP [facility]).   Plan:  Triage nurse went out to lobby and assessed pt.    This pt was taken care of on the same day.   Jarvis Newcomer, LPN [facility]

## 2010-12-20 ENCOUNTER — Telehealth: Payer: Self-pay | Admitting: Family Medicine

## 2010-12-20 NOTE — Telephone Encounter (Signed)
Pt walked in to the Urgent Care here at the [facility].  C/O dizziness, and low BP.  Wife accompanied the patient.  When the wife proceeded to tell the UC staff that patient had complained of dizziness he became agitated and the staff @ the [facility] recommended for the patient to come here to the PCP office. Pt walked out and showed up in our office at the front desk with his complaints. Plan:   Pt was assessed by the triage nurse in our office.  B/P sitting 110/60, pulse 68.  B/P standing 100/58, pulse 84.  Pt has a history of alzheimers, and staggering is a part of his history.  Pt stated, when asked " I feel dizzy while sitting."  Pt is not on B/P medications.  Not due to be seen in  the office until  Sept of 2012.  Denies any acute symptoms of seasonal allergies or acute URI symptoms.  Did not complain of any CP or SOB while assessed. Plan:  Spoke with Dr. Linford Arnold.  Felt reassured with the BP readings triage nurse assessed in our office and  that patient could call back if he starts having any SOB, CP, or unable to move his limbs.  Explained instructions to Mrs Foxworth who was in waiting room with the pt.  Discharged the pt with the instructions given, and pt wife did say pt had complained of some SOB earlier but she failed to mention it before hand.  Told pt wife she could take the patient to the UC for workup of the SOB.  Pt wife said if pt worsened she would take to the hospital. Jarvis Newcomer, LPN [facility]

## 2010-12-21 NOTE — Discharge Summary (Signed)
NAMESEGER, JANI                 ACCOUNT NO.:  [id number]   MEDICAL RECORD NO.:  [id number]          PATIENT TYPE:  OIB   LOCATION:  6531                         FACILITY:  [facility]   PHYSICIAN:  Bruce R. Juanda Chance, MD, FACCDATE OF BIRTH:  1936-07-01   DATE OF ADMISSION:  03/06/2007  DATE OF DISCHARGE:  03/07/2007                         DISCHARGE SUMMARY - REFERRING   DISCHARGE DIAGNOSES:  1. Acute coronary syndrome, with abnormal stress Myoview.  2. Coronary artery disease.  3. Status post bare metal stenting to the right coronary artery and      left anterior descending, as previously described.  4. History of hyperlipidemia.  5. Diabetes.  6. Hypertension.   PROCEDURES PERFORMED:  1. Cardiac catheterization on March 06, 2007 by Dr. Eden Emms.  2. Bare metal stenting to the RCA and LAD by Dr. Juanda Chance on March 06, 2007.   SUMMARY OF HISTORY:  Mr. Sitter is a 75 year old male who was evaluated  in the office secondary to bilateral upper extremity discomfort.  An  echocardiogram showed normal LV function and mild MR.  A stress Myoview  was performed and showed normal ejection fraction.  However, there is  mild to moderate ischemia at the inferior basilar wall.  He initially  declined a cardiac catheterization and preferred medical treatment.  However, with medical treatment, he has been doing well.  However, when  he was seen in the office on February 28, 2007, the patient is now agreeable  to proceed with cardiac catheterization for further evaluation.   PAST MEDICAL HISTORY:  1. ZOCOR intolerance.  2. Hypertension.  3. Hyperlipidemia.  4. Diabetes.   LABORATORY:  On March 07, 2007, prior to discharge, H&H was 12.6 and  37.3, normal indices, platelets 205, WBC 8.2.  Sodium 142, potassium  3.8, BUN 12, creatinine 1.03, glucose 124.  CK-MB postprocedure was  within normal limits. Chest x-ray on February 28, 2007 prior to admission  showed no acute findings.  EKGs showed normal sinus  rhythm, normal axis,  early R-wave.   HOSPITAL COURSE:  Mr. Levi underwent cardiac catheterization by Dr.  Charlton Haws on March 06, 2007.  Ejection fraction was 65%.  He had a 95%  mid-RCA, 60% circumflex, 70% LAD.  Dr. Juanda Chance, after review, performed  bare metal stenting to the RCA and the LAD, reducing these to 0% and  10%, respectively.  Postprocedure, the patient had elevated blood  pressure for which he received p.r.n., labetalol.  Postprocedure,  catheterization site was intact.  He was ambulating in the hall without  difficulty.  Cardiac rehab assisted with education and ambulation.  By  March 07, 2007, after review with Dr. Juanda Chance, it was felt that he could  be discharged home.   DISPOSITION:  The patient is discharged home.  He is asked to maintain a  low-sodium, heart-healthy ADA diet.  Wound care and activities are  dictated per supplemental discharge sheet.  He received new  prescriptions for Plavix 75 mg daily, nitroglycerin 0.4 as needed.  He  was asked to resume his metformin 500 mg b.i.d. on  Thursday p.m.  His  other medications include fish oil 1000 mg b.i.d., multivitamin,  calcium, vitamin D daily, aspirin 81 mg 2 tablets daily, Androderm patch  previously, benazepril 20 mg daily, HCTZ  mg daily, TriCor 145 mg daily,  Razadyne 8 mg 1-2 tablets b.i.d., metoprolol ER 25 mg daily, Benefiber  860 mg daily.  He was asked to bring all medications to all  appointments.   DISCHARGE TIME:  35 minutes.      Joellyn Rued, PA-C      Bruce R. Juanda Chance, MD, [facility]  Electronically Signed    EW/MEDQ  D:  03/07/2007  T:  03/07/2007  Job:  [id number]   cc:   Madolyn Frieze. Jens Som, MD, [facility]  Nani Gasser, M.D.

## 2010-12-21 NOTE — Assessment & Plan Note (Signed)
[facility]                            CARDIOLOGY OFFICE NOTE   NAME:Trivedi, ARELI JOWETT                        MRN:          [id number]  DATE:03/13/2007                            DOB:          12/29/1935    Mr. Cassada presented to the office today with complaints of pain in his  groin, where his catheterization was performed.  There was a bruit noted  on exam and we scheduled him for Dopplers.  He was found to have  pseudoaneurysm.  I discussed this with Dr. Excell Seltzer and Dr. Excell Seltzer will  contact the patient and he will arrange for the patient to come to the  [facility] tomorrow and thrombin will be injected to close the  pseudoaneurysm.     Madolyn Frieze Jens Som, MD, [facility]  Electronically Signed    BSC/MedQ  DD: 03/13/2007  DT: 03/14/2007  Job #: [id number]

## 2010-12-21 NOTE — Cardiovascular Report (Signed)
[name], [name]                 ACCOUNT NO.:  [id number]   MEDICAL RECORD NO.:  [id number]          PATIENT TYPE:  OIB   LOCATION:  6531                         FACILITY:  [facility]   PHYSICIAN:  Tanner R. Juanda Chance, MD, FACCDATE OF BIRTH:  1935/11/24   DATE OF PROCEDURE:  DATE OF DISCHARGE:                            CARDIAC CATHETERIZATION   PAST MEDICAL HISTORY:  [name] is 75 years old and was evaluated for  chest pain earlier this year and had a borderline Myoview scan.  He was  treated medically but then developed recurrent chest pain and was  studied today in the [facility] lab by Dr. Eden [name].  He was found to have a  critical lesion in the mid-right coronary artery and a moderately  critical lesion in the mid LAD.  He was brought upstairs for  intervention.   PROCEDURE:  The procedure was performed via the right femoral artery.  The old sheath was exchanged for a new sheath.  The patient was given  Angiomax bolus infusion and was given 325 mg of chewable aspirin and 600  mg of Plavix and 20 mg of Pepcid.   We first approached the right coronary artery.  We used a 6-French JR-4  guiding catheter with side holes.  We passed a Prowater wire across the  lesion without difficulty.  We predilated with a 2.25 x 50-mm Maverick  performing two inflations up to 8 atmospheres for 30 seconds.  We then  deployed a 2.75 x 18 mm Promus drug-eluting stent.  We deployed this  with one inflation of 16 atmospheres for 30 seconds.  We postdilated  with a 3.0 x 15-mm Quantum Maverick performing two inflations up to 18  atmospheres for 30 seconds.  Final diagnostics were then performed  through the guiding catheter.   We next approached the left anterior descending artery.  We used a Q-4 6-  [place] guiding catheter with side holes.  We passed a Prowater wire  across the lesion with a moderate amount of difficulty.  We then  performed intravascular ultrasound with automatic pullback.  There was a  heavy  plaque burden at the lesion and moderately heavy calcification but  we felt we could treat the lesion without rotational atherectomy.  We  predilated lesion with a 2.25 x 50-mm Maverick balloon performing one  inflation up to 10 atmospheres for 20 seconds.  We then deployed a 2.75  x 18-mm Promus stent overlapping a diagonal branch.  We deployed this  with one inflation of 16 atmospheres for 30 seconds.   We then postdilated the stent with a 3.0 x 15-mm Quantum Maverick  performing two inflations up to 18 atmospheres for 30 seconds.  We then  repeated the intravascular ultrasound run documenting good apposition.  The expansion was slightly suboptimal at the tightest lesion with  dimensions of 3.0 x 2.0 but with the heavy calcification and plaque  burden we felt that was the best we could do.  This was with a 3-0  balloon taken to 18 atmospheres.   Final diagnostic studies were then performed through the guiding  catheter.  The patient tolerated the procedure well and left the  laboratory in satisfactory condition.   RESULTS:  Initially the stenosis in the mid-right coronary artery was  estimated at 95%.  Following stenting this improved to 0%.   Initially the stenosis in the left anterior descending artery was  estimated at 90% and following stenting this improved to 0%.   DISPOSITION:  The patient returned to the post anesthesia unit for  further observation.      Tanner Elvera Lennox Juanda Chance, MD, [facility]  Electronically Signed     BRB/MEDQ  D:  03/06/2007  T:  03/07/2007  Job:  [id number]   cc:   Tanner Frieze. Jens Som, MD, [facility]  [name], M.D.  Tanner Elvera Lennox Juanda Chance, MD, [facility]  Cardipulmonary Lab

## 2010-12-21 NOTE — Assessment & Plan Note (Signed)
[facility]                            CARDIOLOGY OFFICE NOTE   NAME:Boster, WITT PLITT                        MRN:          [id number]  DATE:09/19/2007                            DOB:          1936-05-10    Mr. Daphane Shepherd is a very pleasant gentleman with a history of coronary  disease.  He has had previous PCI of his LAD as well as his right  coronary artery.  The right coronary artery was a PROMUS drug-eluting  stent.  Since that time he has done well.  He denies any chest pain,  dyspnea, palpitations or syncope.  He is walking two miles per day and  he is trying to follow a diet.  Note he does not smoke.   MEDICATIONS AT PRESENT:  1. Aricept 10 mg daily  2. Pravachol 40 mg daily  3. Zoloft 50 mg daily  4. Aspirin  5. Plavix 75 mg daily  6. Multivitamin  7. Calcium  8. Benazepril 20 mg daily  9. Hydrochlorothiazide 12.5 mg daily  10.Lovaza  11.Metoprolol ER 25 mg daily  12.Fenofibrate 160 mg daily  13.metformin 500 mg p.o. b.i.d.   PHYSICAL EXAM:  Shows a blood pressure 128/62.  His pulse is 56.  Weighs  181 pounds.  HEENT is normal.  NECK is supple and no bruits.  CHEST is clear.  CARDIOVASCULAR:  Regular rate and rhythm.  ABDOMEN:  Exam shows no tenderness.  EXTREMITIES:  Show no edema.   His electrocardiogram shows a sinus rhythm at a rate of 52.  The axis is  normal.  There are no significant ST changes noted.   DIAGNOSIS:  1. Coronary artery disease status post PCI with drug-eluting stents -      he is doing well with no chest pain or shortness of breath.  He      will continue on his aspirin, Plavix, beta blocker and ACE      inhibitor.  He will also continue on his statin.  2. Hypertension - his blood pressure is adequately control.  3. Hyperlipidemia - he will continue his present medications including      his Pravachol.  His recent cholesterol panel was outstanding.  We      will have his most recent liver functions forwarded to  [place] with Dr.      Linford Arnold for our records.  4. Diabetes mellitus - managed per Dr. Linford Arnold.  5. History of pseudoaneurysm following previous catheterization.   He will continue with diet, exercise and I will see him back in 1 year.     Madolyn Frieze Jens Som, MD, [facility]  Electronically Signed    BSC/MedQ  DD: 09/19/2007  DT: 09/20/2007  Job #: [id number]   cc:   Nani Gasser, M.D.

## 2010-12-21 NOTE — Cardiovascular Report (Signed)
[name], [name]                 ACCOUNT NO.:  [id number]   MEDICAL RECORD NO.:  [id number]          PATIENT TYPE:  OIB   LOCATION:  1963                         FACILITY:  [facility]   PHYSICIAN:  Peter C. Eden Emms, MD, FACCDATE OF BIRTH:  Feb 27, 1936   DATE OF PROCEDURE:  03/06/2007  DATE OF DISCHARGE:                            CARDIAC CATHETERIZATION   PROCEDURE:  Coronary arteriography.   INDICATIONS:  A 75 year old diabetic with multiple coronary risk  factors, abnormal Myoview suggesting inferior wall ischemia, isolated  episode of bilateral arm pain.   Cine catheterization done with 4-French catheters from right femoral  artery.   Left main coronary artery was normal.   The left anterior descending artery was calcified in its proximal and  mid section.  The proximal vessel had 20-30% multiple discrete lesions.   The mid LAD at the takeoff of the second diagonal branch had a 70%  eccentric lesion.  It appeared significant in LAO cranial views. Distal  vessel was normal.   Circumflex coronary artery was nondominant.  It primarily consisted of a  large first obtuse marginal branch which did not have any significant  disease.  The AV groove branch had 60% disease but was a small vessel.   The right coronary artery was small but dominant.   The mid right coronary artery had a 95% stenosis.  The distal vessel had  no significant disease.   RAO VENTRICULOGRAPHY:  Ventriculography was normal.  EF of 60%.  There  was no gradient across the aortic valve and no MR.  Aortic pressure is  154/62. LV pressure is 155/14.   The patient's rhythm during the case was sinus bradycardia at a rate of  50.   IMPRESSION:  The films will be reviewed with Dr. Excell Seltzer.  I suspect that  he should initially have intervention of the right coronary artery.  I  would then probably stage the LAD as it is a much more complicated  lesion at a bifurcation spot.   Given the patient's age and paucity of  symptoms, I think an initial  attempt at percutaneous treatment is in order.  If the LAD lesion turned  out to be difficult, he may end up being a candidate for CABG.      Noralyn Pick. Eden Emms, MD, [facility]  Electronically Signed     PCN/MEDQ  D:  03/06/2007  T:  03/06/2007  Job:  [id number]   cc:   Nani Gasser, M.D.  Madolyn Frieze Jens Som, MD, [facility]

## 2010-12-21 NOTE — Cardiovascular Report (Signed)
[name], [name]                 ACCOUNT NO.:  [id number]   MEDICAL RECORD NO.:  [id number]          PATIENT TYPE:  OIB   LOCATION:  2899                         FACILITY:  [facility]   PHYSICIAN:  Veverly Fells. Excell Seltzer, MD  DATE OF BIRTH:  [birth year]   DATE OF PROCEDURE:  03/14/2007  DATE OF DISCHARGE:  03/14/2007                            CARDIAC CATHETERIZATION   PROCEDURE:  Thrombin injection.   INDICATIONS:  [name] is a 75 year old gentleman who underwent recent  percutaneous coronary intervention via a right femoral artery approach.  He developed a postprocedure femoral pseudoaneurysm.  He has complained  of pain in the right groin area and was found to have a pulsatile mass.  Ultrasound demonstrated a 2.8 x 2.1 cm pseudoaneurysm with a narrow  neck.  We elected to perform a thrombin injection to obliterate the  pseudoaneurysm.   Procedural details, risks and indications of procedure were explained to  the patient.  Informed consent was obtained.  The right groin was  prepped under normal sterile conditions.  Under ultrasound guidance, a  small amount of thrombin was injected using a 22-gauge 10 cm echo-tipped  needle.  The pseudoaneurysm was completely thrombosed at the conclusion  of the procedure and there was no flow into the pouch.  The femoral  artery was widely patent with normal flow and 2+ distal pulses at the  completion of the procedure.  The patient tolerated the procedure well.  Eight mg of intravenous morphine were given for analgesia.   CONCLUSION:  Successful obliteration of the right femoral pseudoaneurysm  via percutaneous thrombin injection.      Veverly Fells. Excell Seltzer, MD  Electronically Signed     MDC/MEDQ  D:  03/15/2007  T:  03/16/2007  Job:  [id number]

## 2010-12-21 NOTE — Assessment & Plan Note (Signed)
[place] [facility]                            CARDIOLOGY OFFICE NOTE   NAME:Larkey, MACGREGOR AESCHLIMAN                        MRN:          [id number]  DATE:03/21/2007                            DOB:          20-Apr-1936    Mr. Raimondo is a very pleasant gentleman who has coronary disease.  He had  recent PCI on March 06, 2007.  He had his right coronary stented with a  bare metal stent as well as his LAD.  I saw him on March 13, 2007 and we  noted a bruit and he was found to have a pseudoaneurysm.  His aneurysm  was injected with thrombin by Dr. Excell Seltzer and it did resolve.  He has not  had chest pain, shortness of breath, palpitations, or syncope.  He has  had some pain in his right lower extremity.  This is predominantly in  the thigh where he had mild bleeding.   MEDICATIONS:  1. Fish oil 1000 mg p.o. b.i.d.  2. Multivitamin.  3. Aspirin 162 mg p.o. daily.  4. Androderm patch.  5. Calcium.  6. Benazepril 20 mg two daily.  7. Metformin 1000 mg p.o. in the morning and 500 in the evening.  8. Hydrochlorothiazide 12.5 mg p.o. daily.  9. Razadyne.  10.__________.  11.Toprol 25 mg p.o. daily.  12.__________.  13.Plavix.   PHYSICAL EXAMINATION:  Blood pressure of 133/68 and pulse of 64.  He  weighs 180 pounds.  CHEST:  Clear.  CARDIOVASCULAR:  Regular rhythm.  ABDOMEN:  Benign.  EXTREMITIES:  His right groin shows no hematoma and no bruit.  There is  mild ecchymosis of the inner right thigh.  I cannot palpate cords.  There is no edema in the right lower extremity.   DIAGNOSES:  1. Status post PCI.  We will continue with his present medications      including aspirin, Plavix, beta blocker, and ACE inhibitor.  He did      not tolerate Zocor.  He is agreeable to trying Crestor and we will      begin him 5 mg p.o. daily.  If he tolerates we will check lipids      and liver in six weeks and adjust as indicated.  2. Hypertension.  His blood pressure is well-controlled on  his present      medications.  3. History of hyperlipidemia.  As per #1.  4. Diabetes mellitus per Dr. Lawrence Marseilles.  5. Recent pseudoaneurysm.  Status post thrombin injection.   We will see him back in six months.     Madolyn Frieze Jens Som, MD, [facility]  Electronically Signed    BSC/MedQ  DD: 03/21/2007  DT: 03/22/2007  Job #: [id number]

## 2010-12-21 NOTE — Assessment & Plan Note (Signed)
[place] [facility]                            CARDIOLOGY OFFICE NOTE   NAME:Norbeck, DAEMIEN FRONCZAK                        MRN:          [id number]  DATE:03/13/2007                            DOB:          10-30-1935    Mr. Sedler is a pleasant 75 year old gentleman who I recently saw for  exertional bilateral upper extremity discomfort.  A Myoview was normal  and we ultimately scheduled him for a cardiac catheterization which was  performed on March 06, 2007.  The patient's ejection fraction was 60%.  There was a 70% mid LAD at the takeoff of his second diagonal.  The  circumflex had an AV groove branch with a 60% lesion but it was small.  The right coronary artery had a 95% stenosis.  The patient had bare  metal stent to the LAD and right coronary artery by Dr. Juanda Chance.  Since  then he has not had chest pain, shortness of breath.  He has had some  discomfort at his catheterization site with ecchymosis and mild  numbness.   MEDICATIONS:  1. Fish oil 1000 mg p.o. b.i.d.  2. Multivitamin.  3. Aspirin.  4. Androderm patch.  5. Calcium.  6. Vitamin D.  7. Benazepril 20 mg p.o. daily.  8. Metformin.  9. Hydrochlorothiazide 12.5 mg p.o. daily.  10.Razadyne 8 mg one to two p.o. b.i.d.  11.Lovaza 2 p.o. b.i.d.  12.Toprol 25 mg p.o. daily.  13.Plavix 75 mg p.o. daily.  14.Fenofibrate.   PHYSICAL EXAMINATION:  Shows a blood pressure 132/65 and his pulse was  62.  HEENT:  Normal.  NECK:  Supple.  CHEST:  Clear.  CARDIOVASCULAR:  Regular rate and rhythm.  ABDOMINAL:  Benign.  RIGHT GROIN:  Shows a loud bruit but there is no hematoma noted.  There  is ecchymosis that tracks into his inner thigh.  I can not palpate  cords, there is no edema in his lower extremities.   DIAGNOSES:  1. Status post percutaneous coronary intervention of the left anterior      descending and right coronary artery -- He has had no chest pain or      shortness of breath and we will continue  with his aspirin, Plavix,      metoprolol ER.  NOTE HE HAS NOT TOLERATED STATINS IN THE PAST.  He      will also continue on his angiotensin converting enzyme inhibitor.  2. Hypertension -- His blood pressure is well-controlled on his      present medications.  3. History of hyperlipidemia -- HE HAS NOT TOLERATED STATINS IN THE      PAST and he will continue on his present medications.  4. Diabetes mellitus, per Dr. Linford Arnold.  5. Right groin bruit -- We will check arterial Doppler to exclude      pseudoaneurysm which I think is a strong possibility.  I will also      perform venous Dopplers as there is significant ecchymosis over the      inner thighs.  We will make further recommendations based on those  results.  I will see him back in [place] as scheduled.     Madolyn Frieze Jens Som, MD, [facility]  Electronically Signed    BSC/MedQ  DD: 03/13/2007  DT: 03/13/2007  Job #: [id number]   cc:   Nani Gasser, M.D.

## 2010-12-21 NOTE — Assessment & Plan Note (Signed)
[facility]                                 ON-CALL NOTE   NAME:MAYERBailee, Thall                        MRN:          [id number]  DATE:03/18/2007                            DOB:          06-Aug-1936    CARDIOLOGIST:  Dr. Jens Som.   PRIMARY CARE PHYSICIAN:  Dr. Nani Gasser.   SUMMARY OF HISTORY:  [name] is a 75 year old male who has recently  underwent a cardiac catheterization, while in the office note on the 8th  it was felt that he had a bruit at the catheterization site,  pseudoaneurysm was confirmed on March 14, 2007, Dr. Excell Seltzer injected this  with thrombin and he was discharged home.   [name] calls today that stating since the injection by Dr. Copper on  March 14, 2007 he has noticed some tingling down the inner right thigh.  He states that he has not called anybody before now because he just was  not able to sleep well last night and was wondering if it was okay to  take Tylenol or place on a compress on the tingling.  I further  questioned [name] in regards to his catheterization site.  He  described some slight bruising at the catheterization site but there is  no swelling, no discomfort, tenderness drainage and he did not have any  numbness or tingling around the catheterization site.  He denies any  testicular problems.  He states his foot is warm, he is not having any  problems with swelling, lower extremity movement or numbness in his toes  or any other neurological defect.  I explained to him that the only way  we could evaluate his tingling if he presented to the emergency room on  Sunday evening we would be happy to evaluate him, however he was  reluctant to do that.  I further explained that given his medication  list from the office note on August 5, it would be acceptable if he used  Tylenol for any type of discomfort and it would be okay to place  compress on the tingling area.  However, I told him that he must come  to  the emergency room if he should develop a cold foot, testicular problems  or any other neurological defect elsewhere.  He states that he will do  so and he is scheduled to see Dr. Jens Som on Wednesday.  If anything  changes he will call us back.  He is agreeable with this plan.      Tanner Rued, PA-C  Electronically Signed      Gerrit Friends. Dietrich Pates, MD, [facility]  Electronically Signed   EW/MedQ  DD: 03/18/2007  DT: 03/19/2007  Job #: [id number]

## 2010-12-21 NOTE — Assessment & Plan Note (Signed)
[facility]                            CARDIOLOGY OFFICE NOTE   [name], [name]                        MRN:          [id number]  DATE:02/28/2007                            DOB:          [birth year]    [name] is a very pleasant 75 year old gentleman who has diabetes,  hypertension, and hyperlipidemia.  I recently saw him secondary to an  episode of bilateral upper extremity discomfort.  An echocardiogram  showed normal LV function and mild mitral regurgitation.  A stress  Myoview was performed which revealed a normal ejection fraction.  There  was mild to moderate ischemia at the base and the inferior wall.  He  initially declined a cardiac catheterization, and we decided to treat  medically due to that.  We have continued with his aspirin and ACE  inhibitor, and we added a beta blocker as well as Zocor.  He did not  tolerate the Zocor, and it has apparently caused worsening confusion.  Since I last saw him, there is no dyspnea, chest pain, palpitations, or  syncope, and there is no pedal edema.   MEDICATIONS:  1. Fish oil 1000 mg p.o. b.i.d.  2. Multivitamin.  3. Aspirin 81 mg tablets 2 p.o. daily.  4. Androderm patch.  5. Calcium and vitamin D.  6. Benazepril 20 mg p.o. daily.  7. Metformin 500 mg p.o. b.i.d.  8. Hydrochlorothiazide 12.5 mg p.o. daily.  9. TriCor 145 mg p.o. daily.  10.Razadyne 8 mg tablets 1-2 p.o. b.i.d.  11.Lovaza 2 p.o. b.i.d.  12.Metoprolol ER 25 mg p.o. daily.  13.Fenofibrate 160 mg p.o. daily.   PHYSICAL EXAMINATION:  VITAL SIGNS:  Blood pressure 146/68, pulse 59.  HEENT:  Normal.  NECK:  Supple.  CHEST:  Clear.  CARDIOVASCULAR:  Regular rate and rhythm.  There is a 2/6 systolic  murmur at the apex.  ABDOMEN:  Benign.  EXTREMITIES:  Show no edema.   DIAGNOSES:  1. Abnormal nuclear study, with mild to moderate ischemia in the      inferobasilar wall.  We again discussed the options today,      including  continuing medical therapy versus cardiac      catheterization.  Given his risk factors, I feel that cardiac      catheterization is warranted.  He is now agreeable to this.  We      discussed the risks and benefits, and he agrees to proceed.  This      will be arranged as an outpatient.  He will continue with medical      therapy, including his ACE inhibitor, beta blocker, and aspirin.      Note he has not tolerated statins, and we will otherwise continue      with his present cholesterol medicines.  2. Hypertension.  His blood pressure is reasonably well controlled.      We will follow this and increase his medications as indicated.  3. History of hyperlipidemia.  He will continue on his present      medications, and Dr. Linford [name] is managing this.  4. Diabetes  mellitus.  Per Dr. Linford [name].   I will see him back in 4 weeks.     [name] Tanner Som, MD, [facility]  Electronically Signed    BSC/MedQ  DD: 02/28/2007  DT: 03/01/2007  Job #: [id number]   cc:   [name], M.D.

## 2011-01-03 ENCOUNTER — Encounter: Payer: Self-pay | Admitting: Emergency Medicine

## 2011-01-03 ENCOUNTER — Inpatient Hospital Stay (INDEPENDENT_AMBULATORY_CARE_PROVIDER_SITE_OTHER)
Admission: RE | Admit: 2011-01-03 | Discharge: 2011-01-03 | Disposition: A | Discharge status: Home / Self Care | Payer: PRIVATE HEALTH INSURANCE | Source: Ambulatory Visit | Attending: Emergency Medicine | Admitting: Emergency Medicine

## 2011-01-03 DIAGNOSIS — H00019 Hordeolum externum unspecified eye, unspecified eyelid: Secondary | ICD-10-CM | POA: Insufficient documentation

## 2011-02-15 ENCOUNTER — Encounter (INDEPENDENT_AMBULATORY_CARE_PROVIDER_SITE_OTHER): Payer: Medicare Other | Admitting: Psychiatry

## 2011-02-15 DIAGNOSIS — F339 Major depressive disorder, recurrent, unspecified: Secondary | ICD-10-CM

## 2011-02-19 ENCOUNTER — Other Ambulatory Visit: Payer: Self-pay | Admitting: Family Medicine

## 2011-03-14 ENCOUNTER — Telehealth: Payer: Self-pay | Admitting: Family Medicine

## 2011-03-14 MED ORDER — TRANSPORT CHAIR MISC: Status: DC

## 2011-03-14 NOTE — Telephone Encounter (Signed)
[facility] called back [place]) and they are going to need demographics and insurance information faxed to their office at the same # the order was faxed. Plan:  Information requested was printed off and faxed to [phone]. Jarvis Newcomer, LPN [facility]

## 2011-03-14 NOTE — Telephone Encounter (Signed)
Order printed

## 2011-03-14 NOTE — Telephone Encounter (Signed)
[facility] called and pt has spinal stenosis and they need an order for transport chair.  Send the script by fax to [phone]. Routed for order confirmation to Dr. Marlyne Beards, LPN [facility]

## 2011-03-17 ENCOUNTER — Other Ambulatory Visit: Payer: Self-pay | Admitting: Family Medicine

## 2011-03-17 MED ORDER — TRIAMCINOLONE ACETONIDE 0.025 % EX OINT: TOPICAL_OINTMENT | Freq: Two times a day (BID) | CUTANEOUS | Status: AC

## 2011-03-17 NOTE — Telephone Encounter (Signed)
Pt wife notified med sent to pharmacy. KJ LPN

## 2011-03-17 NOTE — Telephone Encounter (Signed)
OK for medication. I sent it over

## 2011-03-17 NOTE — Telephone Encounter (Signed)
Pt is needing a refill of a med I do not see on either med list in either system.  Pt asking for triamcinolone acetonide for kanker sore of the mouth.   Please advise. Plan:  Routed to Dr. Marlyne Beards, LPN [facility]

## 2011-03-21 ENCOUNTER — Encounter: Payer: Self-pay | Admitting: Cardiology

## 2011-03-29 ENCOUNTER — Other Ambulatory Visit: Payer: Self-pay | Admitting: Family Medicine

## 2011-03-31 ENCOUNTER — Other Ambulatory Visit: Payer: Self-pay | Admitting: Family Medicine

## 2011-03-31 ENCOUNTER — Encounter: Payer: Self-pay | Admitting: Family Medicine

## 2011-03-31 ENCOUNTER — Ambulatory Visit
Admission: RE | Admit: 2011-03-31 | Discharge: 2011-03-31 | Disposition: A | Discharge status: Home / Self Care | Payer: Medicare Other | Source: Ambulatory Visit | Attending: Family Medicine | Admitting: Family Medicine

## 2011-03-31 ENCOUNTER — Inpatient Hospital Stay (INDEPENDENT_AMBULATORY_CARE_PROVIDER_SITE_OTHER)
Admission: RE | Admit: 2011-03-31 | Discharge: 2011-03-31 | Disposition: A | Discharge status: Home / Self Care | Payer: Medicare Other | Source: Ambulatory Visit | Attending: Family Medicine | Admitting: Family Medicine

## 2011-03-31 DIAGNOSIS — S20219A Contusion of unspecified front wall of thorax, initial encounter: Secondary | ICD-10-CM

## 2011-03-31 DIAGNOSIS — W19XXXA Unspecified fall, initial encounter: Secondary | ICD-10-CM

## 2011-05-05 ENCOUNTER — Encounter (HOSPITAL_COMMUNITY): Payer: Medicare Other | Admitting: Psychiatry

## 2011-05-17 ENCOUNTER — Ambulatory Visit: Payer: Medicare Other | Admitting: Family Medicine

## 2011-05-17 DIAGNOSIS — Z0289 Encounter for other administrative examinations: Secondary | ICD-10-CM

## 2011-05-19 ENCOUNTER — Encounter (INDEPENDENT_AMBULATORY_CARE_PROVIDER_SITE_OTHER): Payer: Medicare Other | Admitting: Psychiatry

## 2011-05-19 DIAGNOSIS — F0280 Dementia in other diseases classified elsewhere without behavioral disturbance: Secondary | ICD-10-CM

## 2011-05-19 DIAGNOSIS — F339 Major depressive disorder, recurrent, unspecified: Secondary | ICD-10-CM

## 2011-05-23 LAB — BASIC METABOLIC PANEL
CO2: 27
Calcium: 8.8
GFR calc Af Amer: >60
GFR calc non Af Amer: >60
Sodium: 142

## 2011-05-23 LAB — CBC
MCHC: 33.9
RBC: 4.3

## 2011-05-23 LAB — CK TOTAL AND CKMB (NOT AT ARMC)
Relative Index: INVALID
Total CK: 61

## 2011-05-24 ENCOUNTER — Other Ambulatory Visit: Payer: Self-pay | Admitting: Family Medicine

## 2011-06-13 ENCOUNTER — Other Ambulatory Visit (HOSPITAL_COMMUNITY): Payer: Self-pay | Admitting: Psychiatry

## 2011-06-13 DIAGNOSIS — G309 Alzheimer's disease, unspecified: Secondary | ICD-10-CM

## 2011-06-13 MED ORDER — SERTRALINE HCL 100 MG PO TABS: 100.0000 mg | ORAL_TABLET | Freq: Every day | ORAL | Status: DC

## 2011-06-20 ENCOUNTER — Other Ambulatory Visit: Payer: Self-pay | Admitting: Family Medicine

## 2011-07-02 ENCOUNTER — Other Ambulatory Visit: Payer: Self-pay | Admitting: Family Medicine

## 2011-07-04 ENCOUNTER — Other Ambulatory Visit (HOSPITAL_COMMUNITY): Payer: Self-pay | Admitting: Psychiatry

## 2011-07-04 DIAGNOSIS — F0280 Dementia in other diseases classified elsewhere without behavioral disturbance: Secondary | ICD-10-CM

## 2011-07-04 MED ORDER — MIRTAZAPINE 15 MG PO TABS: 15.0000 mg | ORAL_TABLET | Freq: Every day | ORAL | Status: DC

## 2011-07-11 ENCOUNTER — Ambulatory Visit (INDEPENDENT_AMBULATORY_CARE_PROVIDER_SITE_OTHER): Payer: Medicare Other | Admitting: Family Medicine

## 2011-07-11 ENCOUNTER — Encounter: Payer: Self-pay | Admitting: Family Medicine

## 2011-07-11 VITALS — BP 106/52 | HR 85 | Wt 172.0 lb

## 2011-07-11 DIAGNOSIS — G4700 Insomnia, unspecified: Secondary | ICD-10-CM

## 2011-07-11 DIAGNOSIS — Z23 Encounter for immunization: Secondary | ICD-10-CM

## 2011-07-11 DIAGNOSIS — E119 Type 2 diabetes mellitus without complications: Secondary | ICD-10-CM

## 2011-07-11 DIAGNOSIS — R5383 Other fatigue: Secondary | ICD-10-CM

## 2011-07-11 DIAGNOSIS — R531 Weakness: Secondary | ICD-10-CM

## 2011-07-11 DIAGNOSIS — E039 Hypothyroidism, unspecified: Secondary | ICD-10-CM

## 2011-07-11 LAB — HEMOGLOBIN A1C
Hgb A1c MFr Bld: 6.4 % — ABNORMAL HIGH (ref <5.7)
Mean Plasma Glucose: 137 mg/dL — ABNORMAL HIGH (ref <117)

## 2011-07-11 LAB — VITAMIN B12: Vitamin B-12: 729 pg/mL (ref 211–911)

## 2011-07-11 LAB — CK: Total CK: 79 U/L (ref 7–232)

## 2011-07-11 LAB — FOLATE: Folate: >20 ng/mL

## 2011-07-11 MED ORDER — LORAZEPAM 0.5 MG PO TABS: 1.0000 mg | ORAL_TABLET | Freq: Every evening | ORAL | Status: DC | PRN

## 2011-07-11 MED ORDER — LORAZEPAM 0.5 MG PO TABS: 0.5000 mg | ORAL_TABLET | Freq: Every evening | ORAL | Status: DC | PRN

## 2011-07-11 NOTE — Progress Notes (Signed)
Subjective:    Patient ID: [name], male    DOB: 1936/03/24, 75 y.o.   MRN: [id number]  HPI  Insomnia - Wakes up after 2 hours and then will watch TV for about 2-1/2 hours and then will vomit be able to go back to bed.. Wife would like him to try lorazepam , as this is what she uses at night and says it works well for her. He has been more irritable lately.  He continues to have severe gait instability. He currently uses a walker but still falls without because he tries to lift and walk with a walker. Very weak in the legs and now balance.   He has tried exercise and physical therapy in the past without any improvement. He is just very frustrated as well as his wife. They have been to see many specialists and have not been able to find something that really helped him.  Alzheimer's-they have stopped his Aricept and Namenda briefly per M.D. instructions because they felt his dementia had worsened but his wife noticed a bigger difference when she stopped the medication so she restarted it. She truly felt it was helping him. She also complains that he has been more irritable lately. He is on Remeron for this.   Review of Systems     BP 106/52  Pulse 85  Wt 172 lb (78.019 kg)    Allergies  Allergen Reactions  . Atorvastatin     REACTION: Reaction not known  . Donepezil Hydrochloride     REACTION: sick    Past Medical History  Diagnosis Date  . CAD (coronary artery disease)     PCI of his LAD as well as his right coronary artery  . HTN (hypertension)   . HLD (hyperlipidemia)   . Diabetes mellitus   . Hypothyroidism   . Obstructive sleep apnea   . Dementia   . Seizure disorder     Past Surgical History  Procedure Date  . Cholecystectomy   . Pci     right coronary and left anterior descending  . Posterior laminectomy / decompression cervical spine     History   Social History  . Marital Status: Married    Spouse Name: N/A    Number of Children: N/A  . Years of  Education: N/A   Occupational History  . Not on file.   Social History Main Topics  . Smoking status: Former Games developer  . Smokeless tobacco: Not on file   Comment: Quit in 1990.   Marland Kitchen Alcohol Use: No  . Drug Use: No  . Sexually Active:    Other Topics Concern  . Not on file   Social History Narrative   No caffeine, walks for 46-60 min daily.     No family history on file.   Objective:   Physical Exam  Constitutional: He is oriented to person, place, and time. He appears well-developed and well-nourished.  HENT:  Head: Normocephalic and atraumatic.  Cardiovascular: Normal rate, regular rhythm and normal heart sounds.   Pulmonary/Chest: Effort normal and breath sounds normal.  Musculoskeletal:       Upper extremity strength is 5 over 5 in the shoulders, elbows, wrists, fingers. His hip strength is 4/5 bilaterally. Knee and ankle strength are 5 out of 5 bilaterally.  Neurological: He is oriented to person, place, and time.  Skin: Skin is warm and dry.  Psychiatric: He has a normal mood and affect. His behavior is normal.  Assessment & Plan:  Isomnia - Will try lorazepam at bedtime. Call with any concerns if there is an increase in falls or mood change or morning grogginess. We discussed potential side effects. Start with one TAB and increase to 2 if needed. Hopefully the improve sleep will help with his irritability as well.  Alzheimer's-I discussed with his wife as long as she is seeing some potential, since she is his primary caretaker, with the Namenda and Aricept to continue this. His blood pressure looks great. If it some point in the future she is not noticing any significant benefit then it is probably worth stopping. Will check an RPR sent as Alzheimer's seems to be progressing.  Generalized weakness-this seems to be more with his hips. This is probably causing his gait instability. He does have a history of spinal stenosis and is not surgical candidate. He is  currently getting injections for pain unfortunately this will not improve his mobility. His wife would also like me to recheck blood work to make sure he is not deficient in anything such as B12, folate or iron et Karie Soda.  Hypothyroid-we are due to recheck his thyroid level.  Diabetes-he is well over due for his A1c so we'll add this to his blood work as well.

## 2011-07-11 NOTE — Progress Notes (Signed)
Summary: Larey Seat and hurt left ribs   Vital Signs:  Patient Profile:   75 Years Old Male CC:      LT rib pain, post fall x tuesday Height:     67 inches Weight:      178.50 pounds O2 Sat:      96 % O2 treatment:    Room Air Temp:     97.7 degrees F oral Pulse rate:   76 / minute Resp:     18 per minute BP sitting:   134 / 67  (left arm) Cuff size:   regular  Vitals Entered By: Clemens Catholic LPN (March 31, 2011 1:16 PM)                  Prior Medication List:  BAYER ASPIRIN 325 MG TABS (ASPIRIN) Take 1 tablet by mouth once a day RA KRILL OIL  CAPS (KRILL OIL) Take 1 capsule by mouth once a day METFORMIN HCL 1000 MG TABS (METFORMIN HCL) Take 1 tablet by mouth two times a day * GLUCOMETER HE HAS IS ONE TOUCH ULTRA 2. Needs lancet, strips to test 2 x a day. Dx:250.00 * WALKER WITH CHAIR AND HAND BRAKES Dx: Alzheimers and seizure d/o, jonit pain ZOLOFT 100 MG TABS (SERTRALINE HCL) take one tablet by mouth at bedtime MULTIVITAMINS  TABS (MULTIPLE VITAMIN) Take 1 tablet by mouth once a day LEVOTHROID 75 MCG TABS (LEVOTHYROXINE SODIUM) Take 1 tablet by mouth once a day LAMICTAL 100 MG TABS (LAMOTRIGINE) 200mg  two times a day NIASPAN 500 MG CR-TABS (NIACIN (ANTIHYPERLIPIDEMIC)) Take 1 tablet by mouth once a day TAMSULOSIN HCL 0.4 MG CAPS (TAMSULOSIN HCL) 2 tablets at bedtime NAMENDA TITRATION PAK 5 (28)-10 (21) MG TABS (MEMANTINE HCL) As directed FIBER LAXATIVE 625 MG TABS (CALCIUM POLYCARBOPHIL) 2 tablets at bedtime POLYTRIM 10000-0.1 UNIT/ML-% SOLN (POLYMYXIN B-TRIMETHOPRIM) 2 drops in Right eye qid for 7 days DOXYCYCLINE HYCLATE 100 MG CAPS (DOXYCYCLINE HYCLATE) 1 by mouth two times a day for 7 days   Updated Prior Medication List: BAYER ASPIRIN 325 MG TABS (ASPIRIN) Take 1 tablet by mouth once a day RA KRILL OIL  CAPS (KRILL OIL) Take 1 capsule by mouth once a day METFORMIN HCL 1000 MG TABS (METFORMIN HCL) Take 1 tablet by mouth two times a day * GLUCOMETER HE HAS IS ONE  TOUCH ULTRA 2. Needs lancet, strips to test 2 x a day. Dx:250.00 * WALKER WITH CHAIR AND HAND BRAKES Dx: Alzheimers and seizure d/o, jonit pain ZOLOFT 100 MG TABS (SERTRALINE HCL) take one tablet by mouth at bedtime MULTIVITAMINS  TABS (MULTIPLE VITAMIN) Take 1 tablet by mouth once a day LEVOTHROID 75 MCG TABS (LEVOTHYROXINE SODIUM) Take 1 tablet by mouth once a day LAMICTAL 100 MG TABS (LAMOTRIGINE) 200mg  two times a day NIASPAN 500 MG CR-TABS (NIACIN (ANTIHYPERLIPIDEMIC)) Take 1 tablet by mouth once a day TAMSULOSIN HCL 0.4 MG CAPS (TAMSULOSIN HCL) 2 tablets at bedtime NAMENDA TITRATION PAK 5 (28)-10 (21) MG TABS (MEMANTINE HCL) As directed FIBER LAXATIVE 625 MG TABS (CALCIUM POLYCARBOPHIL) 2 tablets at bedtime ASPIRIN 325 MG TABS (ASPIRIN)   Current Allergies (reviewed today): ! * HCTZ ! LIPITOR ! * ARICEPT * SOME CHOLESTEROL MEDSHistory of Present Illness Chief Complaint: LT rib pain, post fall x tuesday History of Present Illness: Patient and wife states he fell Tuesday night hurting his L chest. He skinned up both his kness but he states no problem. No LOC occured. Some discomfort when he takes deep breaths.  Current  Problems: CONTUSION OF CHEST WALL (ICD-922.1) STYE (ICD-373.11) ABSCESS, FACE (ICD-682.0) CHEST PAIN UNSPECIFIED (ICD-786.50) CERVICALGIA (ICD-723.1) SCIATICA (ICD-724.3) URINARY OBSTRUCTION UNSPECIFIED (ICD-599.60) FATIGUE (ICD-780.79) CAD (ICD-414.00) HYPERLIPIDEMIA (ICD-272.4) BRADYCARDIA (ICD-427.89) HYPERTENSION (ICD-401.9) DIZZINESS (ICD-780.4) HYPOTHYROIDISM (ICD-244.9) HYPOGONADISM (ICD-257.2) HEMATURIA UNSPECIFIED (ICD-599.70) DIABETES MELLITUS, TYPE II (ICD-250.00) DEPRESSION (ICD-311) MUSCLE WEAKNESS (GENERALIZED) (ICD-728.87) HYPONATREMIA (ICD-276.1) KNEE PAIN (ICD-719.46) SEIZURE DISORDER (ICD-780.39) BAKER'S CYST, LEFT KNEE (ICD-727.51) LEG PAIN, LEFT (ICD-729.5) ERECTILE DYSFUNCTION (ICD-302.72) DISORDER, DEPRESSIVE NEC  (ICD-311) ABNORMAL STRESS ELECTROCARDIOGRAM (ICD-794.31) OBSTRUCTIVE SLEEP APNEA (ICD-327.23) ALZHEIMER'S DISEASE, MODERATE (ICD-331.0)   Current Meds BAYER ASPIRIN 325 MG TABS (ASPIRIN) Take 1 tablet by mouth once a day RA KRILL OIL  CAPS (KRILL OIL) Take 1 capsule by mouth once a day METFORMIN HCL 1000 MG TABS (METFORMIN HCL) Take 1 tablet by mouth two times a day * GLUCOMETER HE HAS IS ONE TOUCH ULTRA 2. Needs lancet, strips to test 2 x a day. Dx:250.00 * WALKER WITH CHAIR AND HAND BRAKES Dx: Alzheimers and seizure d/o, jonit pain ZOLOFT 100 MG TABS (SERTRALINE HCL) take one tablet by mouth at bedtime MULTIVITAMINS  TABS (MULTIPLE VITAMIN) Take 1 tablet by mouth once a day LEVOTHROID 75 MCG TABS (LEVOTHYROXINE SODIUM) Take 1 tablet by mouth once a day LAMICTAL 100 MG TABS (LAMOTRIGINE) 200mg  two times a day NIASPAN 500 MG CR-TABS (NIACIN (ANTIHYPERLIPIDEMIC)) Take 1 tablet by mouth once a day TAMSULOSIN HCL 0.4 MG CAPS (TAMSULOSIN HCL) 2 tablets at bedtime NAMENDA TITRATION PAK 5 (28)-10 (21) MG TABS (MEMANTINE HCL) As directed FIBER LAXATIVE 625 MG TABS (CALCIUM POLYCARBOPHIL) 2 tablets at bedtime ASPIRIN 325 MG TABS (ASPIRIN)  HYDROCODONE-ACETAMINOPHEN 5-325 MG TABS (HYDROCODONE-ACETAMINOPHEN) 1/2 to 1 by mouth q 8 hrs as needed for pain  REVIEW OF SYSTEMS Constitutional Symptoms      Denies fever, chills, night sweats, weight loss, weight gain, and fatigue.  Eyes       Denies change in vision, eye pain, eye discharge, glasses, contact lenses, and eye surgery. Ear/Nose/Throat/Mouth       Denies hearing loss/aids, change in hearing, ear pain, ear discharge, dizziness, frequent runny nose, frequent nose bleeds, sinus problems, sore throat, hoarseness, and tooth pain or bleeding.  Respiratory       Complains of shortness of breath.      Denies dry cough, productive cough, wheezing, asthma, bronchitis, and emphysema/COPD.      Comments: chest wall pain Cardiovascular       Denies  murmurs, chest pain, and tires easily with exhertion.    Gastrointestinal       Denies stomach pain, nausea/vomiting, diarrhea, constipation, blood in bowel movements, and indigestion. Genitourniary       Denies painful urination, kidney stones, and loss of urinary control. Neurological       Denies headaches, loss of or changes in sensation, numbness, tngling, tremors, paralysis, and fainting/blackouts. Musculoskeletal       Complains of muscle pain and joint pain.      Denies joint stiffness, decreased range of motion, redness, muscle weakness, and gout.      Comments: hit his knee on the ground as well Skin       Complains of bruising.      Denies unusual mles/lumps or sores and hair/skin or nail changes.  Psych       Denies mood changes, temper/anger issues, anxiety/stress, speech problems, depression, and sleep problems. Other Comments: pt states that he fell on Tuesday and injured his Lt rib area. no SOB. he has taken IBF for the pain.  Past History:  Family History: Last updated: 03/31/2011  Negative for coronary artery disease. 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  Married to [place] with 2 adult children.  Quit smoking 1990, no EtOH, no drugs, no caffeine, walks for 46-60 min daily.  Risk Factors: Exercise: no (05/14/2010)  Risk Factors: Smoking Status: quit (05/31/2006)  Past Medical History: Reviewed history from 08/18/2010 and no changes required. CAD (ICD-414.00)- PCI of his LAD as well as his right coronary artery.  hypertension Hyperlipidemia Diabetes mellitus Hypothyroidism Obstructive sleep apnea dementia Seizure disorder  Past Surgical History: Reviewed history from 05/25/2010 and no changes required. Cholecystectomy,  PCI right coronary artery and left anterior descending Cervical spine decompression.   Family History: Reviewed history from 05/25/2010 and no changes  required.  Negative for coronary artery disease. spinal stenosis    Social History: Reviewed history from 03/07/2009 and no changes required. 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  Married to [place] with 2 adult children.  Quit smoking 1990, no EtOH, no drugs, no caffeine, walks for 46-60 min daily. Physical Exam General appearance: well developed, well nourished, no acute distress Head: normocephalic, atraumatic Chest/Lungs: tenderness over the L ribs lungs were clear Heart: regular rate and  rhythm, no murmur Skin: no obvious rashes or lesions MSE: oriented to time, place, and person Assessment Problems:   STYE (ICD-373.11) ABSCESS, FACE (ICD-682.0) CHEST PAIN UNSPECIFIED (ICD-786.50) CERVICALGIA (ICD-723.1) SCIATICA (ICD-724.3) URINARY OBSTRUCTION UNSPECIFIED (ICD-599.60) FATIGUE (ICD-780.79) CAD (ICD-414.00) HYPERLIPIDEMIA (ICD-272.4) BRADYCARDIA (ICD-427.89) HYPERTENSION (ICD-401.9) DIZZINESS (ICD-780.4) HYPOTHYROIDISM (ICD-244.9) HYPOGONADISM (ICD-257.2) HEMATURIA UNSPECIFIED (ICD-599.70) DIABETES MELLITUS, TYPE II (ICD-250.00) DEPRESSION (ICD-311) MUSCLE WEAKNESS (GENERALIZED) (ICD-728.87) HYPONATREMIA (ICD-276.1) KNEE PAIN (ICD-719.46) SEIZURE DISORDER (ICD-780.39) BAKER'S CYST, LEFT KNEE (ICD-727.51) LEG PAIN, LEFT (ICD-729.5) ERECTILE DYSFUNCTION (ICD-302.72) DISORDER, DEPRESSIVE NEC (ICD-311) ABNORMAL STRESS ELECTROCARDIOGRAM (ICD-794.31) OBSTRUCTIVE SLEEP APNEA (ICD-327.23) ALZHEIMER'S DISEASE, MODERATE (ICD-331.0) New Problems: CONTUSION OF CHEST WALL (ICD-922.1)   Patient Education: hydrocodene 5/325  Plan New Medications/Changes: HYDROCODONE-ACETAMINOPHEN 5-325 MG TABS (HYDROCODONE-ACETAMINOPHEN) 1/2 to 1 by mouth q 8 hrs as needed for pain  #20 x 0, 03/31/2011, Hassan Rowan MD  New Orders: T-Chest x-ray, 2 views [71020] T-DG Ribs Unilateral w/Chest*R* [71101] Est. Patient Level III [86578] Follow Up: Follow up on an as  needed basis, Follow up with Primary Physician  The patient and/or caregiver has been counseled thoroughly with regard to medications prescribed including dosage, schedule, interactions, rationale for use, and possible side effects and they verbalize understanding.  Diagnoses and expected course of recovery discussed and will return if not improved as expected or if the condition worsens. Patient and/or caregiver verbalized understanding.  Prescriptions: HYDROCODONE-ACETAMINOPHEN 5-325 MG TABS (HYDROCODONE-ACETAMINOPHEN) 1/2 to 1 by mouth q 8 hrs as needed for pain  #20 x 0   Entered and Authorized by:   Hassan Rowan MD   Signed by:   Hassan Rowan MD on 03/31/2011   Method used:   Printed then faxed to ...       [address] [phone]* (retail)       [address] [place], [place]  [address]       Ph: [phone]       Fax: [phone]   RxID:   [id number]   Patient Instructions: 1)  Please schedule a follow-up appointment as needed. 2)  Please schedule an appointment with your primary doctor in :5-14 days if needed  Orders Added: 1)  T-Chest  x-ray, 2 views [71020] 2)  T-DG Ribs Unilateral w/Chest*R* [71101] 3)  Est. Patient Level III [16109]

## 2011-07-11 NOTE — Progress Notes (Signed)
Summary: LEFT EYE IRRITATION rm 5   Vital Signs:  Patient Profile:   75 Years Old Male CC:      LT eye irritation x 1 day Height:     67 inches Weight:      170 pounds O2 Sat:      96 % O2 treatment:    Room Air Temp:     98.5 degrees F oral Pulse rate:   88 / minute Resp:     16 per minute BP sitting:   111 / 68  (left arm) Cuff size:   regular  Vitals Entered By: Clemens Catholic LPN (Jan 03, 2011 12:25 PM)                  Updated Prior Medication List: BAYER ASPIRIN 325 MG TABS (ASPIRIN) Take 1 tablet by mouth once a day RA KRILL OIL  CAPS (KRILL OIL) Take 1 capsule by mouth once a day METFORMIN HCL 1000 MG TABS (METFORMIN HCL) Take 1 tablet by mouth two times a day * GLUCOMETER HE HAS IS ONE TOUCH ULTRA 2. Needs lancet, strips to test 2 x a day. Dx:250.00 * WALKER WITH CHAIR AND HAND BRAKES Dx: Alzheimers and seizure d/o, jonit pain ZOLOFT 100 MG TABS (SERTRALINE HCL) take one tablet by mouth at bedtime MULTIVITAMINS  TABS (MULTIPLE VITAMIN) Take 1 tablet by mouth once a day LEVOTHROID 75 MCG TABS (LEVOTHYROXINE SODIUM) Take 1 tablet by mouth once a day LAMICTAL 100 MG TABS (LAMOTRIGINE) 200mg  two times a day NIASPAN 500 MG CR-TABS (NIACIN (ANTIHYPERLIPIDEMIC)) Take 1 tablet by mouth once a day TAMSULOSIN HCL 0.4 MG CAPS (TAMSULOSIN HCL) 2 tablets at bedtime NAMENDA TITRATION PAK 5 (28)-10 (21) MG TABS (MEMANTINE HCL) As directed FIBER LAXATIVE 625 MG TABS (CALCIUM POLYCARBOPHIL) 2 tablets at bedtime  Current Allergies (reviewed today): ! * HCTZ ! LIPITOR ! * ARICEPT * SOME CHOLESTEROL MEDSHistory of Present Illness History from: wife and pt Chief Complaint: LT eye irritation x 1 day History of Present Illness: Red, irritated "white bump" left lower eyelid for 1 day. No drainage. No vision change. Had normal eye exam by Dr Hazle Quant, his eye doctor, 1 week ago. No other acute complaints. No sore throat. No cough, No dyspnea. No chest pain. No wheezing.  No  nausea No vomiting. No fever, No chills   REVIEW OF SYSTEMS Constitutional Symptoms       Complains of fatigue.     Denies fever, chills, night sweats, weight loss, and weight gain.  Eyes       Complains of eye pain and eye drainage.      Denies change in vision, glasses, contact lenses, and eye surgery. Ear/Nose/Throat/Mouth       Denies hearing loss/aids, change in hearing, ear pain, ear discharge, dizziness, frequent runny nose, frequent nose bleeds, sinus problems, sore throat, hoarseness, and tooth pain or bleeding.  Respiratory       Denies dry cough, productive cough, wheezing, shortness of breath, asthma, bronchitis, and emphysema/COPD.  Cardiovascular       Denies murmurs, chest pain, and tires easily with exhertion.    Gastrointestinal       Denies stomach pain, nausea/vomiting, diarrhea, constipation, blood in bowel movements, and indigestion. Genitourniary       Denies painful urination, kidney stones, and loss of urinary control. Neurological       Denies paralysis, seizures, and fainting/blackouts. Musculoskeletal       Denies muscle pain, joint pain, joint stiffness, decreased range  of motion, redness, swelling, muscle weakness, and gout.  Skin       Denies bruising, unusual mles/lumps or sores, and hair/skin or nail changes.  Psych       Denies mood changes, temper/anger issues, anxiety/stress, speech problems, depression, and sleep problems. Other Comments: pt c/o a white bump on his LT lower eye lid, redness and irritated x 1 day. he had a eye exam earlier this wk which was normal. no itching, no change in vision. no OTC meds.   Past History:  Past Medical History: Reviewed history from 08/18/2010 and no changes required. CAD (ICD-414.00)- PCI of his LAD as well as his right coronary artery.  hypertension Hyperlipidemia Diabetes mellitus Hypothyroidism Obstructive sleep apnea dementia Seizure disorder  Past Surgical History: Reviewed history from  05/25/2010 and no changes required. Cholecystectomy,  PCI right coronary artery and left anterior descending Cervical spine decompression.   Family History: Reviewed history from 05/25/2010 and no changes required.  Negative for coronary artery disease.  Social History: Reviewed history from 03/07/2009 and no changes required. 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  Married to [place] with 2 adult children.  Quit smoking 1990, no EtOH, no drugs, no caffeine, walks for 46-60 min daily. Physical Exam General appearance: well developed, well nourished, no acute distress.  Head: normocephalic, atraumatic Eyes: R eye normal. Left eye: red, indurated, firm 3x3 mm tender stye L inferior lid. No drainage. No fb or abraision seen. Sclerae and conjunctivae wnl Ears: normal, no lesions or deformities Oral/Pharynx: tongue normal, posterior pharynx without erythema or exudate Neck: neck supple,  trachea midline, no masses. No lymphadenopathy. Assessment New Problems: STYE (ICD-373.11)   Patient Education: warm compresses  Plan New Medications/Changes: DOXYCYCLINE HYCLATE 100 MG CAPS (DOXYCYCLINE HYCLATE) 1 by mouth two times a day for 7 days  #14 x 0, 01/03/2011, Lajean Manes MD POLYTRIM 10000-0.1 UNIT/ML-% SOLN (POLYMYXIN B-TRIMETHOPRIM) 2 drops in Right eye qid for 7 days  #1 bottle x 0, 01/03/2011, Lajean Manes MD  New Orders: New Patient Level II [phone] Services provided After hours-Weekends-Holidays [99051] Planning Comments:   Risks, benefits, alternatives discussed. Wife and Pt voiced understanding and agreement.  Follow Up: Dr Hazle Quant (his eye Dr) if no better in 1 week, sooner if worse  The patient and/or caregiver has been counseled thoroughly with regard to medications prescribed including dosage, schedule, interactions, rationale for use, and possible side effects and they verbalize understanding.  Diagnoses and expected course of recovery discussed and will  return if not improved as expected or if the condition worsens. Patient and/or caregiver verbalized understanding.  Prescriptions: DOXYCYCLINE HYCLATE 100 MG CAPS (DOXYCYCLINE HYCLATE) 1 by mouth two times a day for 7 days  #14 x 0   Entered and Authorized by:   Lajean Manes MD   Signed by:   Lajean Manes MD on 01/03/2011   Method used:   Electronically to        [facility]  [address] [phone]* (retail)       [address]       [place], [place]  [address]       Ph: [phone] or [phone]       Fax: [phone]   RxID:   [id number] POLYTRIM 10000-0.1 UNIT/ML-% SOLN (POLYMYXIN B-TRIMETHOPRIM) 2 drops in Right eye qid for 7 days  #1 bottle x 0   Entered and Authorized by:   Lajean Manes MD   Signed by:   Lajean Manes MD on 01/03/2011  Method used:   [facility]        [facility] [phone][facility] (retail)       [address] [place], [place]  [address]       Ph: [phone] or [phone]       Fax: [phone]   RxID:   [id number]   Orders Added: 1)  New Patient Level II [99202] 2)  Services provided After hours-Weekends-Holidays [99051]

## 2011-07-12 LAB — COMPLETE METABOLIC PANEL WITH GFR
ALT: 16 U/L (ref 0–53)
AST: 17 U/L (ref 0–37)
Alkaline Phosphatase: 88 U/L (ref 39–117)
Sodium: 139 mEq/L (ref 135–145)
Total Bilirubin: 0.4 mg/dL (ref 0.3–1.2)
Total Protein: 6.8 g/dL (ref 6.0–8.3)

## 2011-07-14 ENCOUNTER — Ambulatory Visit (INDEPENDENT_AMBULATORY_CARE_PROVIDER_SITE_OTHER): Payer: Medicare Other | Admitting: Family Medicine

## 2011-07-14 DIAGNOSIS — Z2911 Encounter for prophylactic immunotherapy for respiratory syncytial virus (RSV): Secondary | ICD-10-CM

## 2011-07-14 DIAGNOSIS — Z23 Encounter for immunization: Secondary | ICD-10-CM

## 2011-07-14 NOTE — Progress Notes (Signed)
Here for shingles   vaccine

## 2011-07-27 ENCOUNTER — Encounter: Payer: Self-pay | Admitting: Cardiology

## 2011-07-27 ENCOUNTER — Ambulatory Visit (INDEPENDENT_AMBULATORY_CARE_PROVIDER_SITE_OTHER): Payer: Medicare Other | Admitting: Cardiology

## 2011-07-27 DIAGNOSIS — E7800 Pure hypercholesterolemia, unspecified: Secondary | ICD-10-CM

## 2011-07-27 DIAGNOSIS — I10 Essential (primary) hypertension: Secondary | ICD-10-CM

## 2011-07-27 DIAGNOSIS — I2510 Atherosclerotic heart disease of native coronary artery without angina pectoris: Secondary | ICD-10-CM

## 2011-07-27 DIAGNOSIS — E785 Hyperlipidemia, unspecified: Secondary | ICD-10-CM

## 2011-07-27 NOTE — Progress Notes (Signed)
Addended by: Freddi Starr on: 07/27/2011 11:21 AM   Modules accepted: Orders

## 2011-07-27 NOTE — Progress Notes (Signed)
HPI:[name] is a very pleasant gentleman with a history of coronary disease.  He has had previous PCI of his LAD as well as his right coronary artery in 2008.  The right coronary artery was a PROMUS drug-eluting stent. Myoview performed Jan 2012 revealed EF 75 and normal perfusion. I last saw him in March of 2012. Since then, the patient denies orthopnea, PND, pedal edema, palpitations, syncope or chest pain. He does have some fatigue and dyspnea on exertion. He has limited activities.   Current Outpatient Prescriptions  Medication Sig Dispense Refill  . aspirin (BAYER ASPIRIN) 325 MG tablet Take 325 mg by mouth daily.        Marland Kitchen donepezil (ARICEPT) 10 MG tablet Take 10 mg by mouth daily.       Marland Kitchen lamoTRIgine (LAMICTAL) 100 MG tablet Take 200 mg by mouth 2 (two) times daily.        Marland Kitchen levothyroxine (SYNTHROID, LEVOTHROID) 75 MCG tablet TAKE 1 TABLET EVERY DAY  30 tablet  0  . memantine (NAMENDA TITRATION PAK) tablet pack Take by mouth See admin instructions. 5 mg/day for =1 week; 5 mg twice daily for =1 week; 15 mg/day given in 5 mg and 10 mg separated doses for =1 week; then 10 mg twice daily       . metFORMIN (GLUCOPHAGE) 1000 MG tablet TAKE 1 TABLET TWICE A DAY  180 tablet  1  . mirtazapine (REMERON) 15 MG tablet Take 1 tablet (15 mg total) by mouth at bedtime.  30 tablet  5  . Misc. Devices (TRANSPORT CHAIR) MISC TDx: Neuropathy, dementia  1 each  0  . Multiple Vitamins-Minerals (MULTIVITAL) tablet Take 1 tablet by mouth daily.        Marland Kitchen NIASPAN 500 MG CR tablet TAKE 1 TABLET EVERY DAY  30 tablet  2  . NON FORMULARY Glucometer (has his one touch ultra 2): needs lancet, strips to test 2x a day. Dx:250.00       . RA KRILL OIL CAPS Take by mouth daily.        . sertraline (ZOLOFT) 100 MG tablet Take 1 tablet (100 mg total) by mouth at bedtime.  90 tablet  3  . Tamsulosin HCl (FLOMAX) 0.4 MG CAPS Take 0.8 mg by mouth at bedtime.        . triamcinolone (KENALOG) 0.025 % ointment Apply topically 2  (two) times daily.  30 g  0     Past Medical History  Diagnosis Date  . CAD (coronary artery disease)     PCI of his LAD as well as his right coronary artery  . HTN (hypertension)   . HLD (hyperlipidemia)   . Diabetes mellitus   . Hypothyroidism   . Obstructive sleep apnea   . Dementia   . Seizure disorder     Past Surgical History  Procedure Date  . Cholecystectomy   . Pci     right coronary and left anterior descending  . Posterior laminectomy / decompression cervical spine     History   Social History  . Marital Status: Married    Spouse Name: N/A    Number of Children: N/A  . Years of Education: N/A   Occupational History  . Not on file.   Social History Main Topics  . Smoking status: Former Games developer  . Smokeless tobacco: Not on file   Comment: Quit in 1990.   Marland Kitchen Alcohol Use: No  . Drug Use: No  . Sexually Active:  Other Topics Concern  . Not on file   Social History Narrative   No caffeine, unable to walk by himself outside the home. Uses a walker.    ROS: Poor memory but no fevers or chills, productive cough, hemoptysis, dysphasia, odynophagia, melena, hematochezia, dysuria, hematuria, rash, seizure activity, orthopnea, PND, pedal edema, claudication. Remaining systems are negative.  Physical Exam: Well-developed well-nourished in no acute distress.  Skin is warm and dry.  HEENT is normal.  Neck is supple. No thyromegaly.  Chest is clear to auscultation with normal expansion.  Cardiovascular exam is regular rate and rhythm.  Abdominal exam nontender or distended. No masses palpated. Extremities show no edema. neuro grossly intact  ECG sinus rhythm at a rate of 82. Occasional PVC. Nonspecific ST change.

## 2011-07-27 NOTE — Assessment & Plan Note (Signed)
Continue aspirin. Intolerant to statins. 

## 2011-07-27 NOTE — Assessment & Plan Note (Signed)
Intolerant to statins. Continue present medications. Lipids and liver followed by primary care.

## 2011-07-27 NOTE — Patient Instructions (Signed)
Your physician wants you to follow-up in: ONE YEAR You will receive a reminder letter in the mail two months in advance. If you don't receive a letter, please call our office to schedule the follow-up appointment.  

## 2011-07-27 NOTE — Assessment & Plan Note (Signed)
Blood pressure controlled. 

## 2011-08-15 ENCOUNTER — Encounter (HOSPITAL_COMMUNITY): Payer: Self-pay

## 2011-08-17 ENCOUNTER — Other Ambulatory Visit: Payer: Self-pay | Admitting: *Deleted

## 2011-08-17 MED ORDER — NIACIN ER (ANTIHYPERLIPIDEMIC) 500 MG PO TBCR: 500.0000 mg | EXTENDED_RELEASE_TABLET | Freq: Every day | ORAL | Status: DC

## 2011-08-18 ENCOUNTER — Encounter (HOSPITAL_COMMUNITY): Payer: Self-pay | Admitting: Psychiatry

## 2011-08-18 ENCOUNTER — Ambulatory Visit (INDEPENDENT_AMBULATORY_CARE_PROVIDER_SITE_OTHER): Payer: Medicare Other | Admitting: Psychiatry

## 2011-08-18 VITALS — BP 98/60 | Ht 68.0 in | Wt 170.0 lb

## 2011-08-18 DIAGNOSIS — G309 Alzheimer's disease, unspecified: Secondary | ICD-10-CM

## 2011-08-18 DIAGNOSIS — F0280 Dementia in other diseases classified elsewhere without behavioral disturbance: Secondary | ICD-10-CM

## 2011-08-18 MED ORDER — SERTRALINE HCL 100 MG PO TABS: 150.0000 mg | ORAL_TABLET | Freq: Every day | ORAL | Status: DC

## 2011-08-18 NOTE — Progress Notes (Signed)
   [facility] Behavioral Health Follow-up Outpatient Visit  KHALEN STYER [birth year]   Subjective: The patient is a 76 year old male who is been followed by [facility] since January of 2011. He is currently diagnosed with Alzheimer's dementia. The patient continues to still have issues with balance. He saw his neurologist, Dr. Charlotta Newton, who felt the patient was not benefiting from the Aricept and the Namenda. This was stopped for a short while. However the patient's wife saw decline in function. They have since been restarted. The patient continues to get very anxious easily. He continues to have crying spells. He gets frustrated very easily.  Filed Vitals:   08/18/11 1130  BP: 98/60    Mental Status Examination  Appearance: Casually dressed Alert: Yes Attention: fair  Cooperative: Yes Eye Contact: Fair Speech: Some hesitancy Psychomotor Activity: Decreased, very unstable gait. Wide-based. Memory/Concentration: Poor memory especially for short-term events Oriented: person, place and situation Mood: Euthymic Affect: Restricted Thought Processes and Associations: Linear Fund of Knowledge: Fair Thought Content: No suicidal or homicidal thoughts Insight: Fair Judgement: Poor  Diagnosis: Alzheimer's dementia  Treatment Plan: At this point we'll go up on Zoloft 150 mg daily. I will check on him in 2 months. Wife knows to call with concerns.  Jamse Mead, MD

## 2011-08-22 ENCOUNTER — Other Ambulatory Visit: Payer: Self-pay | Admitting: Family Medicine

## 2011-08-24 ENCOUNTER — Other Ambulatory Visit: Payer: Self-pay | Admitting: Family Medicine

## 2011-09-12 ENCOUNTER — Ambulatory Visit (INDEPENDENT_AMBULATORY_CARE_PROVIDER_SITE_OTHER): Payer: Medicare Other | Admitting: Family Medicine

## 2011-09-12 ENCOUNTER — Encounter: Payer: Self-pay | Admitting: Family Medicine

## 2011-09-12 ENCOUNTER — Ambulatory Visit
Admission: RE | Admit: 2011-09-12 | Discharge: 2011-09-12 | Disposition: A | Discharge status: Home / Self Care | Payer: Medicare Other | Source: Ambulatory Visit | Attending: Family Medicine | Admitting: Family Medicine

## 2011-09-12 VITALS — BP 118/65 | HR 83 | Temp 97.6°F | Wt 172.0 lb

## 2011-09-12 DIAGNOSIS — R079 Chest pain, unspecified: Secondary | ICD-10-CM

## 2011-09-12 DIAGNOSIS — R0781 Pleurodynia: Secondary | ICD-10-CM

## 2011-09-12 DIAGNOSIS — W1800XA Striking against unspecified object with subsequent fall, initial encounter: Secondary | ICD-10-CM

## 2011-09-12 NOTE — Progress Notes (Signed)
  Subjective:    Patient ID: [name], male    DOB: 09-18-1935, 76 y.o.   MRN: [id number]  HPI Feel last week between the toilet and sink and then 5 days later and started having a lot of pain. No cough or SOB. He complains of pain over his sternum that radiates around to his left ribs or his bruising from a fall. He says it is more painful with coughing, sneezing and taking a very deep breath. No fever. He currently uses a walker. He is here today with his wife.   Review of Systems     Objective:   Physical Exam  Constitutional: He is oriented to person, place, and time. He appears well-developed and well-nourished.  HENT:  Head: Normocephalic and atraumatic.  Cardiovascular: Normal rate, regular rhythm and normal heart sounds.   Pulmonary/Chest: Effort normal and breath sounds normal.  Neurological: He is alert and oriented to person, place, and time.  Skin: Skin is warm and dry.  Psychiatric: He has a normal mood and affect.     He has a large amount of bruising over his left ribs under his axilla. He is tender here as well.     Assessment & Plan:  Rib pain status post fall-we'll get x-rays today to evaluate for possible fracture. If this may not change his actual treatment. For now recommend Aleve twice a day with food watch her weight GI upset. We will call with the results.

## 2011-09-12 NOTE — Patient Instructions (Signed)
We will call you with the results 

## 2011-09-13 ENCOUNTER — Telehealth: Payer: Self-pay | Admitting: Family Medicine

## 2011-09-13 DIAGNOSIS — S2239XA Fracture of one rib, unspecified side, initial encounter for closed fracture: Secondary | ICD-10-CM

## 2011-09-13 MED ORDER — HYDROCODONE-ACETAMINOPHEN 5-325 MG PO TABS: 1.0000 | ORAL_TABLET | Freq: Three times a day (TID) | ORAL | Status: DC | PRN

## 2011-09-13 NOTE — Telephone Encounter (Signed)
Please see telephone call for pain medication due to rib fractures.

## 2011-10-10 ENCOUNTER — Telehealth: Payer: Self-pay | Admitting: *Deleted

## 2011-10-10 DIAGNOSIS — F0390 Unspecified dementia without behavioral disturbance: Secondary | ICD-10-CM

## 2011-10-10 DIAGNOSIS — R296 Repeated falls: Secondary | ICD-10-CM

## 2011-10-10 NOTE — Telephone Encounter (Signed)
Wife states that she had knee surgery last week and would like to know if you can get her some help in her home to take care of Abdalrahman. Pt states that her insurance covers home help and left a number for the insurance company. [phone].

## 2011-10-10 NOTE — Telephone Encounter (Signed)
OK to put in order for nursing assistance. Dx demential and frequent falls.

## 2011-10-11 ENCOUNTER — Telehealth: Payer: Self-pay | Admitting: *Deleted

## 2011-10-11 DIAGNOSIS — R296 Repeated falls: Secondary | ICD-10-CM

## 2011-10-11 NOTE — Telephone Encounter (Signed)
Ok will let Jenn know to use [facility].

## 2011-10-11 NOTE — Telephone Encounter (Signed)
Wife calls and states that her physical therapist that is working with her after the knee surgery thinks he can help [place] with his balance issues and falls. Patsy would like to try this. They are using [facility] in [place]

## 2011-10-19 ENCOUNTER — Other Ambulatory Visit: Payer: Self-pay | Admitting: *Deleted

## 2011-10-19 ENCOUNTER — Ambulatory Visit (HOSPITAL_COMMUNITY): Payer: Medicare Other | Admitting: Psychiatry

## 2011-10-19 MED ORDER — AMBULATORY NON FORMULARY MEDICATION: Status: DC

## 2011-10-19 NOTE — Telephone Encounter (Signed)
Insurance will only pay for referral for home health if they have an RN to access for fall prevention; needs order faxed.

## 2011-11-02 DIAGNOSIS — R269 Unspecified abnormalities of gait and mobility: Secondary | ICD-10-CM

## 2011-11-14 ENCOUNTER — Telehealth: Payer: Self-pay | Admitting: *Deleted

## 2011-11-14 ENCOUNTER — Telehealth (HOSPITAL_COMMUNITY): Payer: Self-pay

## 2011-11-14 ENCOUNTER — Encounter: Payer: Self-pay | Admitting: Physician Assistant

## 2011-11-14 ENCOUNTER — Ambulatory Visit (INDEPENDENT_AMBULATORY_CARE_PROVIDER_SITE_OTHER): Payer: Medicare Other | Admitting: Physician Assistant

## 2011-11-14 VITALS — BP 113/59 | HR 95 | Ht 68.0 in | Wt 167.0 lb

## 2011-11-14 DIAGNOSIS — Z9181 History of falling: Secondary | ICD-10-CM

## 2011-11-14 DIAGNOSIS — R079 Chest pain, unspecified: Secondary | ICD-10-CM

## 2011-11-14 DIAGNOSIS — R0781 Pleurodynia: Secondary | ICD-10-CM

## 2011-11-14 MED ORDER — IBUPROFEN 800 MG PO TABS: 800.0000 mg | ORAL_TABLET | Freq: Three times a day (TID) | ORAL | Status: AC | PRN

## 2011-11-14 NOTE — Telephone Encounter (Signed)
Wife states he is coming in this am to see Jade. Told her to mention the abrasion while they are here.

## 2011-11-14 NOTE — Patient Instructions (Addendum)
STart Ibuprofen 800mg  three times a day alternate with tylenolol. Use cold packs on areas of muscle feel tight and there is pain. Sugars have decreased so decrease Metformin to 500mg  twice a day which is 1/2 tabs twice a day. Make sure you stay very hydrated. Call psychiatrist decrease remeron to maybe decrease falls. Recheck 2 months.   Check blood pressure periodically. I think he would feel better if blood pressure was a little higher at 130/80-85.

## 2011-11-14 NOTE — Telephone Encounter (Signed)
OK, does he need to be checked out?

## 2011-11-14 NOTE — Progress Notes (Signed)
  Subjective:    Patient ID: [name], male    DOB: April 09, 1936, 76 y.o.   MRN: [id number]  HPI Patient reports to the clinic due to right sided rib pain from fall this morning. Wife accompanies patient and reports he has fallen at least 5 times in the last 2 weeks. He does have a walker but does not like to [place] it appropriately. He does have alzheimer's disease and patient is often very confused. Patient reports the pain on his right side more of a discomfort and affects the way he sleeps. He reports he does not feel more dizzy upon standing but it is more just while walking or first thing in the am. Wife reports his sugars have been running in the 60's and she noticed confusion and instability is worse when this happens. He does not like to drink water and doesn't really get thirsty. They have not been taking blood pressure to see if they are dropping too low.       Review of Systems     Objective:   Physical Exam  Constitutional: He is oriented to person, place, and time. He appears well-developed and well-nourished.  HENT:  Head: Normocephalic.  Cardiovascular: Normal rate, regular rhythm and normal heart sounds.   Pulmonary/Chest: Effort normal and breath sounds normal. He has no wheezes.  Musculoskeletal: Normal range of motion.       Patient had full ROM of right arm with no pain illicited. Strength was 5/5 of bilateral arms. Ribs palapated with no areas of worsened pain over ribs. No step off noted. Areas of tenderness with muscle palpation over right side.  Neurological: He is alert and oriented to person, place, and time.  Skin:       Scrape on left side of forehead. No contusion or discoloration of skin on right side where patients last fall happened.          Assessment & Plan:  History of falling/right side pain-STart Ibuprofen 800mg  three times a day alternate with tylenolol. Perhaps the ibuprofen would call bp to increase a little bit also.Use cold packs on areas of  muscle feel tight and there is pain. Sugars have decreased so decrease Metformin to 500mg  twice a day which is 1/2 tabs twice a day. Make sure you stay very hydrated. Call psychiatrist decrease remeron to maybe decrease falls. Recheck 2 months.   Check blood pressure periodically. I think he would feel better if blood pressure was a little higher at 130/80-85.

## 2011-11-14 NOTE — Telephone Encounter (Signed)
PT was calling to inform you that the pt fell at home prior to her arrival on Friday. States he has a small abrasion on his forehead from where he fell. States that if we have any questions we can call the wife.  FYI

## 2011-11-14 NOTE — Telephone Encounter (Signed)
Pt wife called he is falling and dr. Eppie Gibson believes it may be remeron would like to discuss

## 2011-11-15 NOTE — Telephone Encounter (Signed)
Spoke to the wife. Patient was falling prior to Remeron. Wife doesn't see much difference with Remeron. We will discontinue just in case.

## 2011-12-02 ENCOUNTER — Telehealth: Payer: Self-pay | Admitting: *Deleted

## 2011-12-02 NOTE — Telephone Encounter (Signed)
Sounds perfect

## 2011-12-02 NOTE — Telephone Encounter (Signed)
FYI: Gave VO to cont PT for 2 times a week x 2 weeks and then 1 times a week for 2 weeks to work with wife and pt on fall preventative and balancing issues.

## 2011-12-05 ENCOUNTER — Encounter (HOSPITAL_COMMUNITY): Payer: Self-pay | Admitting: Psychiatry

## 2011-12-05 ENCOUNTER — Ambulatory Visit (INDEPENDENT_AMBULATORY_CARE_PROVIDER_SITE_OTHER): Payer: Medicare Other | Admitting: Psychiatry

## 2011-12-05 VITALS — BP 120/78 | Ht 68.0 in | Wt 170.0 lb

## 2011-12-05 DIAGNOSIS — F0280 Dementia in other diseases classified elsewhere without behavioral disturbance: Secondary | ICD-10-CM

## 2011-12-05 DIAGNOSIS — F068 Other specified mental disorders due to known physiological condition: Secondary | ICD-10-CM

## 2011-12-05 DIAGNOSIS — F329 Major depressive disorder, single episode, unspecified: Secondary | ICD-10-CM

## 2011-12-05 DIAGNOSIS — G309 Alzheimer's disease, unspecified: Secondary | ICD-10-CM

## 2011-12-05 NOTE — Progress Notes (Signed)
   [facility] Behavioral Health Follow-up Outpatient Visit  ERCOLE GEORG 12/14/1935   Subjective: The patient is a 76 year old male who is been followed by [facility] since January of 2011. He is currently diagnosed with Alzheimer's dementia. The patient continues to still have issues with balance. He continues to fall. The wife left him approximately one month ago for 30 minutes. He was to stay in his chair while she was gone. When his daughter came to the house, he had RE fallen once outside and the phone on the weekend. He has hurt his shoulder from falling. He is seeing an orthopedist on Thursday regarding this, and has been given Vicodin for pain. His wife fell approximately 2 months ago while in the garden trying to catch him. She required her knee surgery. The patient is no longer at the left home alone. There is someone from the [place] comes to visit Wednesday afternoons, and gives the wife a bit of a break.  The patient still gets up at night frequently to urinate. He endorses good sleep besides that. Weight is down a little bit. Patient denies any crying spells. Mood has been stable. The writer called and April stated that Dr. Linford Arnold upstairs was concerned that the patient was on Remeron. She was afraid that his following was attributable to this. I doubt it at the time, but we did stop her Remeron just in case. There's been no change since the Remeron was stopped.  Filed Vitals:   12/05/11 1434  BP: 120/78    Mental Status Examination  Appearance: Casually dressed Alert: Yes Attention: fair  Cooperative: Yes Eye Contact: Fair Speech: Some hesitancy Psychomotor Activity: Decreased, very unstable gait. Wide-based. Memory/Concentration: Poor memory especially for short-term events Oriented: person, place and situation Mood: Euthymic Affect: Restricted Thought Processes and Associations: Linear Fund of Knowledge: Fair Thought Content: No suicidal or  homicidal thoughts Insight: Fair Judgement: Poor  Diagnosis: Alzheimer's dementia  Treatment Plan: We will not make any changes today. I will see the patient back in 3 months.  Jamse Mead, MD

## 2011-12-07 ENCOUNTER — Other Ambulatory Visit: Payer: Self-pay | Admitting: Family Medicine

## 2012-02-15 ENCOUNTER — Ambulatory Visit (HOSPITAL_COMMUNITY): Payer: Medicare Other | Admitting: Psychiatry

## 2012-02-16 ENCOUNTER — Encounter (HOSPITAL_COMMUNITY): Payer: Self-pay | Admitting: Psychiatry

## 2012-02-16 ENCOUNTER — Ambulatory Visit (INDEPENDENT_AMBULATORY_CARE_PROVIDER_SITE_OTHER): Payer: Medicare Other | Admitting: Psychiatry

## 2012-02-16 VITALS — BP 118/72 | Ht 68.0 in | Wt 160.0 lb

## 2012-02-16 DIAGNOSIS — F329 Major depressive disorder, single episode, unspecified: Secondary | ICD-10-CM

## 2012-02-16 DIAGNOSIS — F0280 Dementia in other diseases classified elsewhere without behavioral disturbance: Secondary | ICD-10-CM

## 2012-02-16 MED ORDER — SERTRALINE HCL 100 MG PO TABS: 200.0000 mg | ORAL_TABLET | Freq: Every day | ORAL | Status: DC

## 2012-02-16 NOTE — Progress Notes (Signed)
   [facility] Behavioral Health Follow-up Outpatient Visit  JOESIAH LONON [birth year]   Subjective: The patient is a 76 year old male who is been followed by [facility] since January of 2011. He is currently diagnosed with Alzheimer's dementia and major depressive disorder. The patient continues to still have issues with balance. He continues to fall. He presents today a wheelchair. He is actually down 10 pounds at today's visit. The patient reports he's just not hungry. His balance continues to slowly deteriorate. His daughter will come around and visit, but his son works nights and sleeps during the day. The son will try to visit, once every 2 weeks. His wife will make sure that the patient gets out at least once a day. He has not seen his neurologist lately. The wife sees him as more reactive. If she gets a little bit irritable he will start to cry and feel sorry for himself. The patient reports that all he cares about right now is that his grandson making the high school baseball team. His short-term memory is still extremely poor, and he looks to his wife for guidance throughout all appointments. His wife is asking if we can go up on Zoloft.  Filed Vitals:   02/16/12 1444  BP: 118/72    Mental Status Examination  Appearance: Casually dressed Alert: Yes Attention: fair  Cooperative: Yes Eye Contact: Fair Speech: Some hesitancy Psychomotor Activity: Decreased, very unstable gait. Wide-based. Memory/Concentration: Poor memory especially for short-term events Oriented: person, place and situation Mood: Euthymic Affect: Restricted Thought Processes and Associations: Linear Fund of Knowledge: Fair Thought Content: No suicidal or homicidal thoughts Insight: Fair Judgement: Poor  Diagnosis: Alzheimer's dementia, MDD  Treatment Plan: I will increase Zoloft to 200 mg daily. I will see the patient back in 2 months. Wife may call with concerns.  Jamse Mead, MD

## 2012-02-17 ENCOUNTER — Other Ambulatory Visit: Payer: Self-pay | Admitting: Family Medicine

## 2012-03-02 ENCOUNTER — Telehealth: Payer: Self-pay | Admitting: *Deleted

## 2012-03-02 MED ORDER — AMBULATORY NON FORMULARY MEDICATION: Status: DC

## 2012-03-02 NOTE — Telephone Encounter (Signed)
Wife is trying to get a lift chair and she needs a rx faxed to company. LC 200 easy comfort model. Fax to [employer] to [phone] attn: DC. Wife states has spoke with sompany and can get with no sales tax and insurance pay some on it if you write a script. Says he is having a harder time getting up and mobility

## 2012-03-02 NOTE — Telephone Encounter (Signed)
That is fine. If you will write a prescription I will sign it. Thank you.

## 2012-03-02 NOTE — Telephone Encounter (Signed)
Rx faxed to [facility]

## 2012-03-19 ENCOUNTER — Telehealth: Payer: Self-pay | Admitting: Family Medicine

## 2012-03-19 NOTE — Telephone Encounter (Signed)
Needs appt to f/u for DM

## 2012-03-19 NOTE — Telephone Encounter (Signed)
Spouse notified

## 2012-03-20 ENCOUNTER — Encounter: Payer: Self-pay | Admitting: Family Medicine

## 2012-03-20 ENCOUNTER — Ambulatory Visit (INDEPENDENT_AMBULATORY_CARE_PROVIDER_SITE_OTHER): Payer: Medicare Other | Admitting: Family Medicine

## 2012-03-20 VITALS — BP 107/56 | HR 73 | Ht 68.0 in | Wt 158.0 lb

## 2012-03-20 DIAGNOSIS — R5381 Other malaise: Secondary | ICD-10-CM

## 2012-03-20 DIAGNOSIS — R296 Repeated falls: Secondary | ICD-10-CM

## 2012-03-20 DIAGNOSIS — R5383 Other fatigue: Secondary | ICD-10-CM

## 2012-03-20 DIAGNOSIS — Z9181 History of falling: Secondary | ICD-10-CM

## 2012-03-20 DIAGNOSIS — E039 Hypothyroidism, unspecified: Secondary | ICD-10-CM

## 2012-03-20 DIAGNOSIS — E119 Type 2 diabetes mellitus without complications: Secondary | ICD-10-CM

## 2012-03-20 NOTE — Progress Notes (Signed)
  Subjective:    Patient ID: [name], male    DOB: 18-Jun-1936, 76 y.o.   MRN: [id number]  HPI DM- Here for diabetic f/u/  Has been 8 months.  Fasting sugars have been well controlled. Had an episode of shakes on Sunday while at the resturant.   Feels very tired with minimal activityNo CP ro SOB.  Not skipping meals. Says not cuts that are not healing well.    Says does fall requently.  No blood in the urine or stool.Had PT for about 1.5 months. Made house safer.  Moved his bed downlower. Has a chair lift now.   Has shower handles.    Neurologist rx the lamictal.     Review of Systems     Objective:   Physical Exam  Constitutional: He is oriented to person, place, and time. He appears well-developed and well-nourished.  HENT:  Head: Normocephalic and atraumatic.  Neck: Neck supple. No thyromegaly present.  Cardiovascular: Normal rate, regular rhythm and normal heart sounds.        No carotid or abdominal bruits.  Pulmonary/Chest: Effort normal and breath sounds normal.  Musculoskeletal: He exhibits no edema.  Lymphadenopathy:    He has no cervical adenopathy.  Neurological: He is alert and oriented to person, place, and time.  Skin: Skin is warm and dry.       He has multiple bruises and excoriations on his arms.  Psychiatric: He has a normal mood and affect. His behavior is normal.          Assessment & Plan:  DM - A1C looks great today.  Foot exam performed today.  Continue current regimen. I would like to make sure his kidney function still looks good. If not we may need to consider discontinuing the metformin. Continue to eat a healthy diet. He is unable to get any consistent aerobic exercise because of his gait disturbance and frequent falls. Followup in about 5 months for repeat A1c. He is due for urine microalbumin we will try to get a sample today.  Frequent falls-he is Re: had physical therapy and home. This point I do not have any further recommendations. Continue  to work on safety and continue to work on strengthening of the lower extremities.  Hypothyroidism - Recheck TSH.  No recent weight or skin or hair changes.  Fatigue-we'll check a CBC to rule out anemia. In addition to B12 and ferritin. He denies any blood in his urine or stool. Also check TSH to make sure that his thyroid is well adjusted.

## 2012-03-21 LAB — COMPLETE METABOLIC PANEL WITH GFR
ALT: 15 U/L (ref 0–53)
CO2: 23 mEq/L (ref 19–32)
Calcium: 9 mg/dL (ref 8.4–10.5)
Chloride: 101 mEq/L (ref 96–112)
Creat: 1.25 mg/dL (ref 0.50–1.35)
GFR, Est African American: 65 mL/min
GFR, Est Non African American: 56 mL/min — ABNORMAL LOW
Glucose, Bld: 148 mg/dL — ABNORMAL HIGH (ref 70–99)
Total Bilirubin: 0.3 mg/dL (ref 0.3–1.2)
Total Protein: 6.6 g/dL (ref 6.0–8.3)

## 2012-03-21 LAB — CBC WITH DIFFERENTIAL/PLATELET
Basophils Absolute: 0.1 10*3/uL (ref 0.0–0.1)
Basophils Relative: 1 % (ref 0–1)
Eosinophils Relative: 3 % (ref 0–5)
HCT: 37.7 % — ABNORMAL LOW (ref 39.0–52.0)
Hemoglobin: 12.9 g/dL — ABNORMAL LOW (ref 13.0–17.0)
MCH: 28.4 pg (ref 26.0–34.0)
MCHC: 34.2 g/dL (ref 30.0–36.0)
MCV: 82.9 fL (ref 78.0–100.0)
Monocytes Absolute: 0.7 10*3/uL (ref 0.1–1.0)
Monocytes Relative: 9 % (ref 3–12)
RDW: 14.7 % (ref 11.5–15.5)

## 2012-03-27 ENCOUNTER — Ambulatory Visit (INDEPENDENT_AMBULATORY_CARE_PROVIDER_SITE_OTHER): Payer: Medicare Other | Admitting: Family Medicine

## 2012-03-27 ENCOUNTER — Encounter: Payer: Self-pay | Admitting: Family Medicine

## 2012-03-27 VITALS — BP 96/51 | HR 77 | Temp 97.5°F | Wt 158.0 lb

## 2012-03-27 DIAGNOSIS — I959 Hypotension, unspecified: Secondary | ICD-10-CM

## 2012-03-27 DIAGNOSIS — S32009A Unspecified fracture of unspecified lumbar vertebra, initial encounter for closed fracture: Secondary | ICD-10-CM

## 2012-03-27 DIAGNOSIS — S2239XA Fracture of one rib, unspecified side, initial encounter for closed fracture: Secondary | ICD-10-CM

## 2012-03-27 DIAGNOSIS — R0602 Shortness of breath: Secondary | ICD-10-CM

## 2012-03-27 MED ORDER — TRAMADOL HCL 50 MG PO TABS: 50.0000 mg | ORAL_TABLET | Freq: Four times a day (QID) | ORAL | Status: DC | PRN

## 2012-03-27 MED ORDER — KETOROLAC TROMETHAMINE 60 MG/2ML IM SOLN
60.0000 mg | Freq: Once | INTRAMUSCULAR | Status: AC
  Administered 2012-03-27: 60 mg via INTRAMUSCULAR

## 2012-03-27 NOTE — Progress Notes (Signed)
  Subjective:    Patient ID: [name], male    DOB: 04/14/1936, 76 y.o.   MRN: [id number]  HPI Cold sweat.  No pain.   Dx with transverse spinous process fracture on 03/26/2012.  Larey Seat backwards and hit the edge of the bathtub.  Using tramadol for pain relief.  Hasn't filled the percocet yet.  Fell 3 days ago.   Had a CT of the abd, pelvis with contrast.  Showed acute left 10th rib fracture. Also acute left transverse process fractures at L1 and L2. He also had an enlarged prostate as well as an exophytic left renal lesion most likely a high attenuation cyst.   Review of Systems     Objective:   Physical Exam  Constitutional: He is oriented to person, place, and time. He appears well-developed and well-nourished.  HENT:  Head: Normocephalic and atraumatic.  Cardiovascular: Normal rate, regular rhythm and normal heart sounds.   Pulmonary/Chest: Effort normal and breath sounds normal.  Musculoskeletal:       He has a large hematoma to his left lower back. He has some small bruises that appear to be healing over the left lower ribs. He is tender over the lower lumbar spine. He is also tender over the bruising and hematoma. Legs were shaking when I first entered the room but stopped as we started talking.    Neurological: He is alert and oriented to person, place, and time.  Skin: Skin is warm and dry.  Psychiatric: He has a normal mood and affect. His behavior is normal.          Assessment & Plan:  Rib fracture - Discussed that some of his sweats and shakes in his legs may be from pain.  He is afebrile today and has not had a fever earlier Given toradol IM 60mg  daily for acute pain relief.  See if he seems a little but more comfortable this evening after the injection if the leg shakes and sweats and fibula but better. I did encourage her to increase the tramadol it every 6 hours for better pain relief. If this is not helping and she does have a prescription for Percocet which she can  get him. She can start with even a quarter half a pill if needed. They did give him an incentive spirometer to help prevent pneumonia. Only day 3 after the actual injury says normally he is still on the low end of the differential but would like to recheck a CBC just to make sure there is no sign that his white count is climbing. His white blood count was 8 on admission. To check his electrolytes to make sure these are normal as well. Also encourage hydration. If the blood work is normal but to see him back in one week to make sure that he is improving.  Blood pressure is low today-this could be from his Flomax. His wife made it to the morning time today. Also could be from some mild hydration so I did encourage her to push fluids with him.  Transverse spinal process fracture at L1 and L2.- Please see suggestion for pain control regimen above under rib fracture. Call if any problems or worsening signs or symptoms.

## 2012-03-27 NOTE — Patient Instructions (Signed)
We will call you with your lab results. If you don't here from us in about a week then please give us a call at [phone].  

## 2012-03-28 LAB — CBC WITH DIFFERENTIAL/PLATELET
Basophils Absolute: 0.1 10*3/uL (ref 0.0–0.1)
Basophils Relative: 1 % (ref 0–1)
Eosinophils Absolute: 0.3 10*3/uL (ref 0.0–0.7)
Eosinophils Relative: 4 % (ref 0–5)
HCT: 36.6 % — ABNORMAL LOW (ref 39.0–52.0)
Hemoglobin: 12.8 g/dL — ABNORMAL LOW (ref 13.0–17.0)
Lymphocytes Relative: 25 % (ref 12–46)
Lymphs Abs: 1.7 10*3/uL (ref 0.7–4.0)
MCH: 28.8 pg (ref 26.0–34.0)
MCHC: 35 g/dL (ref 30.0–36.0)
MCV: 82.2 fL (ref 78.0–100.0)
Monocytes Absolute: 0.9 10*3/uL (ref 0.1–1.0)
Monocytes Relative: 13 % — ABNORMAL HIGH (ref 3–12)
Neutro Abs: 4 10*3/uL (ref 1.7–7.7)
Neutrophils Relative %: 57 % (ref 43–77)
Platelets: 250 10*3/uL (ref 150–400)
RBC: 4.45 MIL/uL (ref 4.22–5.81)
RDW: 14.5 % (ref 11.5–15.5)
WBC: 7 10*3/uL (ref 4.0–10.5)

## 2012-03-28 LAB — COMPLETE METABOLIC PANEL WITH GFR
ALT: 12 U/L (ref 0–53)
AST: 12 U/L (ref 0–37)
Albumin: 3.9 g/dL (ref 3.5–5.2)
Alkaline Phosphatase: 95 U/L (ref 39–117)
BUN: 18 mg/dL (ref 6–23)
CO2: 27 mEq/L (ref 19–32)
Calcium: 9.4 mg/dL (ref 8.4–10.5)
Chloride: 102 mEq/L (ref 96–112)
Creat: 1.22 mg/dL (ref 0.50–1.35)
GFR, Est African American: 67 mL/min
GFR, Est Non African American: 58 mL/min — ABNORMAL LOW
Glucose, Bld: 123 mg/dL — ABNORMAL HIGH (ref 70–99)
Potassium: 4.1 mEq/L (ref 3.5–5.3)
Sodium: 138 mEq/L (ref 135–145)
Total Bilirubin: 0.5 mg/dL (ref 0.3–1.2)
Total Protein: 6.3 g/dL (ref 6.0–8.3)

## 2012-04-03 ENCOUNTER — Encounter: Payer: Self-pay | Admitting: Family Medicine

## 2012-04-03 ENCOUNTER — Ambulatory Visit (INDEPENDENT_AMBULATORY_CARE_PROVIDER_SITE_OTHER): Payer: Medicare Other | Admitting: Family Medicine

## 2012-04-03 VITALS — BP 112/60 | HR 82

## 2012-04-03 DIAGNOSIS — G259 Extrapyramidal and movement disorder, unspecified: Secondary | ICD-10-CM

## 2012-04-03 DIAGNOSIS — M4800 Spinal stenosis, site unspecified: Secondary | ICD-10-CM

## 2012-04-03 DIAGNOSIS — S2239XA Fracture of one rib, unspecified side, initial encounter for closed fracture: Secondary | ICD-10-CM

## 2012-04-03 DIAGNOSIS — IMO0002 Reserved for concepts with insufficient information to code with codable children: Secondary | ICD-10-CM

## 2012-04-03 DIAGNOSIS — R259 Unspecified abnormal involuntary movements: Secondary | ICD-10-CM

## 2012-04-03 MED ORDER — ROPINIROLE HCL 0.25 MG PO TABS: 0.2500 mg | ORAL_TABLET | Freq: Every evening | ORAL | Status: DC

## 2012-04-03 NOTE — Progress Notes (Signed)
  Subjective:    Patient ID: [name], male    DOB: 1935-10-19, 76 y.o.   MRN: [id number]  HPI Says the rib fractures are doing some better.  Says was hurting last night on his tail bone. No skin breakdown.  Taking half a tab of percocet.  Was confused after took a whole pill.  Goes to place in [facility] for injection and see if may help his pain. Not using the tramadol.    Having leg shaking on and off that started 2 weeks ago.  Put a vibrator on them and liquidl multivitamin at night that helps. No cramping or pain in his legs. Feels better to move them. No no other worsening or alleviating symptoms. This started around the time that he fell hitting his back and causing a fracture and rib fractures.   Review of Systems     Objective:   Physical Exam  Constitutional: He appears well-developed and well-nourished.  HENT:  Head: Normocephalic and atraumatic.  Pulmonary/Chest:       The bruising over his left ribs is healing he still mildly tender. Also the bruising to his spine is improving as well. He's not be significantly tender over the lumbar spine but he does have some edema still there. He is tender over the coccyx but no bruising and no popping or clicking of the bone itself. He needs assistance with transfer from the wheelchair to the table.  Neurological: He is alert.  Skin: Skin is warm and dry.  Psychiatric: He has a normal mood and affect. His behavior is normal.          Assessment & Plan:  Rib fracture-healing well. Continue current regimen.  Spinal fracture-status post fall. He does appear to be doing better this time. His pain appears to be better. His wife plans on getting him in with the orthopedist for possible injections which he has tolerated well in the past. This may even reduce his need for the narcotics. Wean as tolerated.  RLS- we did discuss his symptoms could be consistent with restless leg syndrome. At the onset since we very brisk for this. This can be worse  at night. They do feel better when he moves them. We will try a low dose of Requip. We discussed potential side effects. Followup in one month to see how this is doing. Also consider this could be coming from his spinal stenosis.  Consider further imaging with MRI of the spine if he is not improving since he did fall and cause fractures at L1-L2.  Diabetes-he is well overdue for followup. Encouraged him to followup in one month.

## 2012-04-19 ENCOUNTER — Ambulatory Visit (HOSPITAL_COMMUNITY): Payer: Self-pay | Admitting: Psychiatry

## 2012-04-20 ENCOUNTER — Ambulatory Visit (INDEPENDENT_AMBULATORY_CARE_PROVIDER_SITE_OTHER): Payer: Medicare Other | Admitting: Psychiatry

## 2012-04-20 ENCOUNTER — Encounter (HOSPITAL_COMMUNITY): Payer: Self-pay | Admitting: Psychiatry

## 2012-04-20 VITALS — BP 112/75 | Ht 68.0 in | Wt 158.0 lb

## 2012-04-20 DIAGNOSIS — F0280 Dementia in other diseases classified elsewhere without behavioral disturbance: Secondary | ICD-10-CM

## 2012-04-20 DIAGNOSIS — G309 Alzheimer's disease, unspecified: Secondary | ICD-10-CM

## 2012-04-20 DIAGNOSIS — F329 Major depressive disorder, single episode, unspecified: Secondary | ICD-10-CM

## 2012-04-20 NOTE — Progress Notes (Signed)
   [facility] Follow-up Outpatient Visit  [name] [birth year]   Subjective: The patient is a 76 year old male who is been followed by [facility] since January of 2011. He is currently diagnosed with Alzheimer's dementia and major depressive disorder. At his last appointment, I increased his Zoloft to 200 mg daily secondary to increased reactivity. He presents today with his wife. The patient continues to still have issues with balance. He continues to fall. He presents today a wheelchair. He is down an additional 2 pounds today. He fell recently in the bathroom and hit his back on the top. He's had back pain since. He also broke a rib. He has been getting injections. He has been started on Requip through his neurologist for restless leg. His wife reports it's gotten so bad she's had to stop the car so he can get out. He was keeping him awake at night. I discussed with wife the possibility of normal pressure hydrocephalus. The patient has to urinate approximately one time per hour. He has not incontinent at this time. She will bring this up with his neurologist. I have suggested she call an adult daycare on [facility]. I have provided her with the information.  Filed Vitals:   04/20/12 1417  BP: 112/75    Mental Status Examination  Appearance: Casually dressed Alert: Yes Attention: fair  Cooperative: Yes Eye Contact: Fair Speech: Some hesitancy Psychomotor Activity: Decreased, very unstable gait. Wide-based. Memory/Concentration: Poor memory especially for short-term events Oriented: person, place and situation Mood: Euthymic Affect: Restricted Thought Processes and Associations: Linear Fund of Knowledge: Fair Thought Content: No suicidal or homicidal thoughts Insight: Fair Judgement: Poor  Diagnosis: Alzheimer's dementia, MDD  Treatment Plan: I will not make any changes today. I will see the patient back in 3 months. Wife to call with  concerns.  Jamse Mead, MD

## 2012-04-23 ENCOUNTER — Telehealth (HOSPITAL_COMMUNITY): Payer: Self-pay

## 2012-04-24 NOTE — Telephone Encounter (Signed)
Spoke with Dr. Maury Dus. He does not believe that it is normal pressure hydrocephalus. Spoke with patient's wife. They will keep appointment in October.

## 2012-05-02 ENCOUNTER — Other Ambulatory Visit: Payer: Self-pay | Admitting: Family Medicine

## 2012-05-04 ENCOUNTER — Ambulatory Visit: Payer: Self-pay | Admitting: Family Medicine

## 2012-05-04 DIAGNOSIS — Z0289 Encounter for other administrative examinations: Secondary | ICD-10-CM

## 2012-06-01 DIAGNOSIS — G959 Disease of spinal cord, unspecified: Secondary | ICD-10-CM | POA: Insufficient documentation

## 2012-06-01 DIAGNOSIS — G40209 Localization-related (focal) (partial) symptomatic epilepsy and epileptic syndromes with complex partial seizures, not intractable, without status epilepticus: Secondary | ICD-10-CM | POA: Insufficient documentation

## 2012-06-01 DIAGNOSIS — R279 Unspecified lack of coordination: Secondary | ICD-10-CM | POA: Insufficient documentation

## 2012-06-01 DIAGNOSIS — G40309 Generalized idiopathic epilepsy and epileptic syndromes, not intractable, without status epilepticus: Secondary | ICD-10-CM | POA: Insufficient documentation

## 2012-06-01 DIAGNOSIS — M48061 Spinal stenosis, lumbar region without neurogenic claudication: Secondary | ICD-10-CM | POA: Insufficient documentation

## 2012-06-01 DIAGNOSIS — R259 Unspecified abnormal involuntary movements: Secondary | ICD-10-CM | POA: Insufficient documentation

## 2012-06-01 DIAGNOSIS — R269 Unspecified abnormalities of gait and mobility: Secondary | ICD-10-CM | POA: Insufficient documentation

## 2012-06-05 ENCOUNTER — Ambulatory Visit (INDEPENDENT_AMBULATORY_CARE_PROVIDER_SITE_OTHER): Payer: Medicare Other | Admitting: Family Medicine

## 2012-06-05 ENCOUNTER — Encounter: Payer: Self-pay | Admitting: Family Medicine

## 2012-06-05 VITALS — BP 109/56 | HR 77 | Ht 68.0 in

## 2012-06-05 DIAGNOSIS — R259 Unspecified abnormal involuntary movements: Secondary | ICD-10-CM

## 2012-06-05 DIAGNOSIS — S61219A Laceration without foreign body of unspecified finger without damage to nail, initial encounter: Secondary | ICD-10-CM

## 2012-06-05 DIAGNOSIS — Z23 Encounter for immunization: Secondary | ICD-10-CM

## 2012-06-05 DIAGNOSIS — E119 Type 2 diabetes mellitus without complications: Secondary | ICD-10-CM

## 2012-06-05 DIAGNOSIS — G259 Extrapyramidal and movement disorder, unspecified: Secondary | ICD-10-CM

## 2012-06-05 LAB — POCT GLYCOSYLATED HEMOGLOBIN (HGB A1C): Hemoglobin A1C: 6.2

## 2012-06-05 MED ORDER — ROPINIROLE HCL 0.25 MG PO TABS: 0.2500 mg | ORAL_TABLET | Freq: Every day | ORAL | Status: DC

## 2012-06-05 NOTE — Patient Instructions (Signed)
start applying Vaseline to the wound on the posterior finger. Continue to keep covered for at least the next 4-5 days.

## 2012-06-05 NOTE — Progress Notes (Signed)
  Subjective:    Patient ID: [name], male    DOB: [birth year], 76 y.o.   MRN: [id number]  HPI Diabetes mellitus-he's doing well over all. He is due for A1c today.  Finger laceration to the fifth digit on the left hand. He fell and hit his hand on a drawer knob. Sliced the top and bottom of the fifth digit. He is here to have the sutures removed. He says it's healing well. No drainage or sinus infection per his wife is here with him today. He still continues to fall frequently. He saw his neurologist last week and they're doing some further imaging with a repeat brain MRI.  He is doing well no Requip. We started this for his leg shaking and movement. No suspicious of possible restless leg syndrome even though this was not classic we decided to try the medication. His wife reports is working well with like a refill sent to the pharmacy.   Review of Systems     Objective:   Physical Exam  Constitutional: He appears well-developed and well-nourished.  Skin: Skin is warm and dry.  Psychiatric: He has a normal mood and affect. His behavior is normal.    9 sutures were removed from the posterior fifth digit on the left hand. A running suture was removed on the anterior portion of the fifth digit. There is some significant bruising and swelling but no erythema or drainage or discharge. Dermabond was placed over the wound on the anterior finger because of poor wound healing.      Assessment & Plan:  Diabetes-A1C is 6.2 today, well controlled.  Has been unable to give urine in the past for urine micro   Finger laceration-9 sutures removed from the posterior finger. A running suture was removed from the anterior portion of the finger. There is still a significant amount of swelling and bruising. No significant erythema or drainage. He has only some mild flexion of the finger. The wound on the ventral side of the finger still has some laxity and is not well-healed. Thus applied Dermabond to give  this more support for couple of days. He can start applying Vaseline to the wound on the posterior finger. Continue to keep covered for at least the next 4-5 days.  Abnormal leg movements-he's doing well on the Requip. I suspect to be getting better. Refill sent to pharmacy. His neurologist is aware.  Flu vaccine given today.

## 2012-06-07 LAB — POCT UA - MICROALBUMIN
Albumin/Creatinine Ratio, Urine, POC: <30
Creatinine, POC: 10 mg/dL

## 2012-06-08 ENCOUNTER — Telehealth: Payer: Self-pay | Admitting: *Deleted

## 2012-06-08 NOTE — Telephone Encounter (Signed)
Pt wife notified of MD instructions.

## 2012-06-08 NOTE — Telephone Encounter (Signed)
Can use delsym and honey.  If fever or still coughing into next week make appt

## 2012-06-08 NOTE — Telephone Encounter (Signed)
Pt up a lot last night with dry cough and wife wants to know what she can give him for this. Please advise

## 2012-06-21 ENCOUNTER — Other Ambulatory Visit: Payer: Self-pay | Admitting: Family Medicine

## 2012-07-16 ENCOUNTER — Encounter: Payer: Self-pay | Admitting: Family Medicine

## 2012-07-16 ENCOUNTER — Ambulatory Visit (INDEPENDENT_AMBULATORY_CARE_PROVIDER_SITE_OTHER): Payer: Medicare Other | Admitting: Family Medicine

## 2012-07-16 VITALS — BP 131/64 | Temp 97.6°F

## 2012-07-16 DIAGNOSIS — L0390 Cellulitis, unspecified: Secondary | ICD-10-CM

## 2012-07-16 DIAGNOSIS — T8131XA Disruption of external operation (surgical) wound, not elsewhere classified, initial encounter: Secondary | ICD-10-CM

## 2012-07-16 DIAGNOSIS — L0291 Cutaneous abscess, unspecified: Secondary | ICD-10-CM

## 2012-07-16 MED ORDER — SULFAMETHOXAZOLE-TRIMETHOPRIM 800-160 MG PO TABS: ORAL_TABLET | ORAL | Status: AC

## 2012-07-16 NOTE — Patient Instructions (Signed)
Do not replace the steri-strips unless either one falls off.  Twice a day gently dab the wound with a warm washcloth that has been saturated with dilute soapy water.  Allow to dry completely and then cover with small amount of triple antibiotic ointment, a new clean gauze, and then wrap in the elastic bandage for protection and to keep the gauze in place.    Return on Thursday or Friday for a re-check.

## 2012-07-16 NOTE — Progress Notes (Signed)
CC: [name] is a 76 y.o. male is here for f/u sutures   Subjective: HPI:  14 days ago patient had a fall suffered a laceration to the left elbow, 8 stitches were placed at [facility] and he was started on Keflex. Unfortunately over the past 2-3 days the wound seems to have opened up and the stitches do not seem to be providing support. There's been some clear drainage from the wound but no bleeding or purulent discharge. Patient states he is in no pain whatsoever. Then denies fevers chills nausea or vomiting.  Using triple antibiotic limited no other interventions    Review Of Systems Outlined In HPI  Past Medical History  Diagnosis Date  . CAD (coronary artery disease)     PCI of his LAD as well as his right coronary artery  . HTN (hypertension)   . HLD (hyperlipidemia)   . Diabetes mellitus   . Hypothyroidism   . Obstructive sleep apnea   . Dementia   . Seizure disorder      No family history on file.   History  Substance Use Topics  . Smoking status: Former Games developer  . Smokeless tobacco: Not on file     Comment: Quit in 1990.   Marland Kitchen Alcohol Use: No     Objective: Filed Vitals:   07/16/12 1445  BP: 131/64  Temp: 97.6 F (36.4 C)    General: Alert and Oriented, No Acute Distress Lungs: Comfortable work of breathing, no accessory muscle use Cardiac: Regular rate and rhythm. Extremities: No peripheral edema.  Strong peripheral pulses. On the left lateral elbow there is a 4 cm x 2 cm laceration that appears to have dehisced, there is granulation tissue centrally and 8 sutures in the proximal aspect of the lesion. There is a 1 cm border of erythema around the wound Mental Status: No depression, anxiety, nor agitation. Skin: Warm and dry.  Assessment & Plan: [name] was seen today for f/u sutures.  Diagnoses and associated orders for this visit:  Cellulitis - sulfamethoxazole-trimethoprim (SEPTRA DS) 800-160 MG per tablet; One by mouth twice a day  for ten days.  Dehiscence of closure of skin - sulfamethoxazole-trimethoprim (SEPTRA DS) 800-160 MG per tablet; One by mouth twice a day for ten days.    8 sutures were removed without difficulty or complication. I would like the patient to start Bactrim for suspected cellulitis. The lesion will need to heal by secondary intention, dilute soap soaks twice a day, antibiotic ointment, sterile gauze, and Ace wrap for protection. Return for reevaluation Thursday morning Return in about 4 days (around 07/20/2012).

## 2012-07-19 ENCOUNTER — Ambulatory Visit (INDEPENDENT_AMBULATORY_CARE_PROVIDER_SITE_OTHER): Payer: Medicare Other | Admitting: Family Medicine

## 2012-07-19 ENCOUNTER — Encounter: Payer: Self-pay | Admitting: Family Medicine

## 2012-07-19 VITALS — BP 119/66 | HR 66 | Temp 97.6°F

## 2012-07-19 DIAGNOSIS — T8131XA Disruption of external operation (surgical) wound, not elsewhere classified, initial encounter: Secondary | ICD-10-CM

## 2012-07-19 NOTE — Progress Notes (Signed)
CC: [name] is a 76 y.o. male is here for Wound Check   Subjective: HPI:  Patient returns accompanied by his wife for slowly healing skin laceration. They started Bactrim. They're dressing the wound with sterile gauze twice a day, wrapping an Ace bandage and using antibiotic ointment. They had replaced the Steri-Strips twice. Discharge includes slightly bloody but straw-colored fluid. They're watching the wound with dilute soap twice a day. Family is unsure whether or not improving or not.  Patient denies fevers, chills, nausea, vomiting, rapid heartbeat, nor left upper extremity pain   Review Of Systems Outlined In HPI  Past Medical History  Diagnosis Date  . CAD (coronary artery disease)     PCI of his LAD as well as his right coronary artery  . HTN (hypertension)   . HLD (hyperlipidemia)   . Diabetes mellitus   . Hypothyroidism   . Obstructive sleep apnea   . Dementia   . Seizure disorder      No family history on file.   History  Substance Use Topics  . Smoking status: Former Games developer  . Smokeless tobacco: Not on file     Comment: Quit in 1990.   Marland Kitchen Alcohol Use: No     Objective: Filed Vitals:   07/19/12 1149  BP: 119/66  Pulse: 66  Temp: 97.6 F (36.4 C)    General: Alert and Oriented, No Acute Distress  Lungs: Comfortable work of breathing, no accessory muscle use  Cardiac: Regular rate and rhythm.  Extremities: No peripheral edema. Strong peripheral pulses. On the left lateral elbow there is a 4 cm x 2 cm laceration that appears to have dehisced, there is granulation tissue and absolutely no erythema in or around the wound. Tissue looks healthy and viable and the wound  Mental Status: No depression, anxiety, nor agitation.  Skin: Warm and dry.   Assessment & Plan: Trayce was seen today for wound check.  Diagnoses and associated orders for this visit:  Dehiscence of closure of skin    improved  Expressed my pleasure to the wound looks much better  than earlier in in the week, I like him to continue Bactrim, continue changing the dressing twice a day, apply Steri-Strips only if the others falloff and do not approximate the 2 edges completely to each other. I've asked him to return on Monday or Tuesday of next week  Return in about 5 days (around 07/24/2012).

## 2012-07-20 ENCOUNTER — Ambulatory Visit (HOSPITAL_COMMUNITY): Payer: Self-pay | Admitting: Psychiatry

## 2012-07-23 ENCOUNTER — Ambulatory Visit: Payer: Medicare Other | Admitting: Family Medicine

## 2012-07-23 ENCOUNTER — Ambulatory Visit (INDEPENDENT_AMBULATORY_CARE_PROVIDER_SITE_OTHER): Payer: Medicare Other | Admitting: Family Medicine

## 2012-07-23 ENCOUNTER — Encounter: Payer: Self-pay | Admitting: Family Medicine

## 2012-07-23 VITALS — BP 84/49 | HR 76

## 2012-07-23 DIAGNOSIS — T8131XA Disruption of external operation (surgical) wound, not elsewhere classified, initial encounter: Secondary | ICD-10-CM

## 2012-07-23 NOTE — Progress Notes (Signed)
CC: [name] is a 76 y.o. male is here for Wound Check   Subjective: HPI:  Patient returns accompanied by daughter and wife  He had a a rough weekend with multiple falls, one of which he landed on the left elbow.  Full to not seem to be preceded by anything in particular specifically not occurring after vertical position changes. Left elbow has been bleeding over the weekend but has subsided for the most part. Patient denies pain in the left elbow or left upper extremity. Family has been dressing this and changing twice a day, gently clean with washrag. Continues on Bactrim. Patient denies fevers, chills, swollen lymph nodes, nausea, vomiting.   Review Of Systems Outlined In HPI  Past Medical History  Diagnosis Date  . CAD (coronary artery disease)     PCI of his LAD as well as his right coronary artery  . HTN (hypertension)   . HLD (hyperlipidemia)   . Diabetes mellitus   . Hypothyroidism   . Obstructive sleep apnea   . Dementia   . Seizure disorder      No family history on file.   History  Substance Use Topics  . Smoking status: Former Games developer  . Smokeless tobacco: Not on file     Comment: Quit in 1990.   Marland Kitchen Alcohol Use: No     Objective: Filed Vitals:   07/23/12 1503  BP: 84/49  Pulse: 76    General: Alert and Oriented, No Acute Distress Lungs:  Comfortable work of breathing Cardiac: Regular rate and rhythm.  Extremities: No peripheral edema.  Strong peripheral pulses.  Mental Status: No depression, anxiety, nor agitation. Skin: Warm and dry. 4 cm long poorly healing laceration that appears to be 2-3 cm deep into the subcutaneous fat, appears slightly enlarged since last visit. Skin tear just distal to that with hemostasis already achieved. No erythema or signs of active infection  Assessment & Plan: [name] was seen today for wound check.  Diagnoses and associated orders for this visit:  Dehiscence of closure of skin - AMB referral to wound care  center     redressed the wound with Steri-Strips ,sterile gauze, and an Ace bandage. Referring to wound care/plastic surgery for further management. Discussed importance of protecting the elbow with either an elbow pad or additional Ace bandage wraps.  Family declines home health safety eval or physical therapy at this time  Return if symptoms worsen or fail to improve.

## 2012-07-24 ENCOUNTER — Telehealth: Payer: Self-pay | Admitting: *Deleted

## 2012-07-24 DIAGNOSIS — T8131XA Disruption of external operation (surgical) wound, not elsewhere classified, initial encounter: Secondary | ICD-10-CM

## 2012-07-24 MED ORDER — AMBULATORY NON FORMULARY MEDICATION: Status: DC

## 2012-07-24 NOTE — Telephone Encounter (Signed)
Faxed demographics, insurance card,last office note, and order to [phone] @[place]   [facility]. Spoke with [place] there and they will call to get PA for pt.

## 2012-08-03 ENCOUNTER — Encounter (HOSPITAL_COMMUNITY): Payer: Self-pay | Admitting: Psychiatry

## 2012-08-03 ENCOUNTER — Ambulatory Visit (INDEPENDENT_AMBULATORY_CARE_PROVIDER_SITE_OTHER): Payer: Medicare Other | Admitting: Psychiatry

## 2012-08-03 VITALS — BP 118/72 | Ht 68.0 in | Wt 158.0 lb

## 2012-08-03 DIAGNOSIS — F0280 Dementia in other diseases classified elsewhere without behavioral disturbance: Secondary | ICD-10-CM

## 2012-08-03 DIAGNOSIS — F331 Major depressive disorder, recurrent, moderate: Secondary | ICD-10-CM

## 2012-08-03 MED ORDER — SERTRALINE HCL 100 MG PO TABS: 200.0000 mg | ORAL_TABLET | Freq: Every day | ORAL | Status: DC

## 2012-08-03 NOTE — Progress Notes (Signed)
   [facility] Follow-up Outpatient Visit  [name] 12-Nov-1935   Subjective: The patient is a 76 year old male who is been followed by [facility] since January of 2011. He is currently diagnosed with Alzheimer's dementia and major depressive disorder. At his last appointment, I did not make any changes. I did call his neurologist after the appointment to discuss whether or not the patient met criteria for normal pressure hydrocephalus. The neurologist stated no. The patient presents today with wife. He has fallen numerous times. He has stitches in his left elbow which he continuously reopens when he falls. Wound care is coming now coming to the house. The patient is complaining about leg pain a lot. He continues to get emotional. He will cry or yell and scream at his wife. He will beg her to leave him. The wife does feel overwhelmed at times. The patient gets up numerous times in the night. He will watch TV or go to the bathroom. Appetite is good and weight is stable. Filed Vitals:   08/03/12 1418  BP: 118/72    Mental Status Examination  Appearance: Casually dressed Alert: Yes Attention: fair  Cooperative: Yes Eye Contact: Fair Speech: Some hesitancy Psychomotor Activity: Decreased, very unstable gait. Wide-based. Memory/Concentration: Poor memory especially for short-term events Oriented: person, place and situation Mood: Euthymic Affect: Restricted Thought Processes and Associations: Linear Fund of Knowledge: Fair Thought Content: No suicidal or homicidal thoughts Insight: Fair Judgement: Poor  Diagnosis: Alzheimer's dementia, MDD  Treatment Plan: I will not make any changes today. I will see the patient back in 3 months. Wife to call with concerns.  Jamse Mead, MD

## 2012-08-06 ENCOUNTER — Telehealth: Payer: Self-pay | Admitting: Family Medicine

## 2012-08-06 NOTE — Telephone Encounter (Signed)
Awaiting a fax

## 2012-08-06 NOTE — Telephone Encounter (Signed)
Wound Care Nurse went out to see this patient today and need a nurse for Dr. Linford Arnold to call them back today at [phone]

## 2012-08-28 ENCOUNTER — Telehealth: Payer: Self-pay | Admitting: *Deleted

## 2012-08-28 MED ORDER — AMBULATORY NON FORMULARY MEDICATION: Status: DC

## 2012-08-28 NOTE — Telephone Encounter (Signed)
We should be able to work on a home health agency to get this for him. If he currently working with one such as [facility] for her advanced home care?

## 2012-08-28 NOTE — Telephone Encounter (Signed)
Wife calls & is wanting to know about how to get a hospital bed in the home for [place].  Please advise

## 2012-08-28 NOTE — Telephone Encounter (Signed)
LMOM for pt to call back.

## 2012-08-29 ENCOUNTER — Encounter: Payer: Self-pay | Admitting: *Deleted

## 2012-08-29 NOTE — Telephone Encounter (Signed)
Pt notified.  Wife states that they are not currently set up with home health care.

## 2012-08-29 NOTE — Telephone Encounter (Signed)
We can pick one and send order to them

## 2012-08-30 NOTE — Telephone Encounter (Signed)
Order & insurance information sent to [facility] in [place]. I spoke with Arline Asp.  [phone]

## 2012-09-17 ENCOUNTER — Telehealth: Payer: Self-pay | Admitting: Family Medicine

## 2012-09-17 ENCOUNTER — Ambulatory Visit (INDEPENDENT_AMBULATORY_CARE_PROVIDER_SITE_OTHER): Payer: Medicare Other | Admitting: Family Medicine

## 2012-09-17 ENCOUNTER — Ambulatory Visit (INDEPENDENT_AMBULATORY_CARE_PROVIDER_SITE_OTHER): Payer: Medicare Other

## 2012-09-17 VITALS — BP 83/51 | HR 62

## 2012-09-17 DIAGNOSIS — M7918 Myalgia, other site: Secondary | ICD-10-CM

## 2012-09-17 DIAGNOSIS — K5900 Constipation, unspecified: Secondary | ICD-10-CM

## 2012-09-17 DIAGNOSIS — M169 Osteoarthritis of hip, unspecified: Secondary | ICD-10-CM

## 2012-09-17 DIAGNOSIS — IMO0001 Reserved for inherently not codable concepts without codable children: Secondary | ICD-10-CM

## 2012-09-17 DIAGNOSIS — M1610 Unilateral primary osteoarthritis, unspecified hip: Secondary | ICD-10-CM

## 2012-09-17 MED ORDER — BISACODYL 10 MG RE SUPP: 10.0000 mg | RECTAL | Status: DC | PRN

## 2012-09-17 NOTE — Progress Notes (Signed)
CC: [name] is a 77 y.o. male is here for f/u after fall   Subjective: HPI:  Patient presents after sustaining a fall yesterday. This occurred in the shower when he was leaning forward and hit his left forehead against a fixture.  It also sounds like he may have swung to the left and landed on his left buttock/hip.  He tells me he was conscious before during and after the event, he denies feeling lightheaded/dizzy/unsteady prior during or after the event. He denies an irregular heartbeat, nor chest pain that occurred prior during or after the event.  He complains of one out of 10 left buttock pain but no headache or discomfort in the forehead. He denies any motor or sensory disturbances prior during or after the event above. He denies double vision, trouble hearing, dizziness, limb numbness nor limb weakness. He denies trouble speaking, swallowing, nor with concentration.  He denies fevers, chills, shortness of breath, back pain, leg pain, neck pain.  He complains of 5 days of not having a bowel movement. He denies any pain in abdomen. He denies any recent diarrhea. They have tried MiraLax and a vegetable stool softener within the last 24 hours but no other interventions.    Review Of Systems Outlined In HPI  Past Medical History  Diagnosis Date  . CAD (coronary artery disease)     PCI of his LAD as well as his right coronary artery  . HTN (hypertension)   . HLD (hyperlipidemia)   . Diabetes mellitus   . Hypothyroidism   . Obstructive sleep apnea   . Dementia   . Seizure disorder      No family history on file.   History  Substance Use Topics  . Smoking status: Former Games developer  . Smokeless tobacco: Not on file     Comment: Quit in 1990.   Marland Kitchen Alcohol Use: No     Objective: Filed Vitals:   09/17/12 1130  BP: 83/51  Pulse: 62    General: Alert and Oriented, No Acute Distress HEENT: Pupils equal, round, reactive to light. Conjunctivae clear.  External ears unremarkable,  canals clear with intact TMs with appropriate landmarks.  Middle ear appears open without effusion. Pink inferior turbinates.  Moist mucous membranes, pharynx without inflammation nor lesions.  Neck supple without palpable lymphadenopathy nor abnormal masses. Uvula is midline Lungs: Clear to auscultation bilaterally, no wheezing/ronchi/rales.  Comfortable work of breathing. Good air movement. Cardiac: Regular rate and rhythm.   Abdomen: Soft nontender Extremities: No peripheral edema.  Strong peripheral pulses.  Full range of motion and strength in all 4 extremities. There is mild to moderate soft tissue swelling in the left buttock without overlying skin changes. Buttock pain is not reproduced with internal or external rotation of the femur nor with bearing weight. Neuro: CN II-XII grossly intact, full strength/rom of all four extremities, C5/L4/S1 DTRs 2/4 bilaterally, unable to walk at baseline, rapid alternating movements normal, heel-shin test normal, able to stand unassisted without balance issues. There is no bruising nor pain at the occiput Mental Status: No depression, anxiety, nor agitation. Skin: Warm and dry. 3 cm diameter soft tissue swelling with ecchymosis just above the left forehead.  Assessment & Plan: [name] was seen today for f/u after fall.  Diagnoses and associated orders for this visit:  Unspecified constipation - bisacodyl (MAGIC BULLETS) 10 MG suppository; Place 1 suppository (10 mg total) rectally as needed for constipation.  Left buttock pain - DG Hip Complete Left; Future  Constipation: Encouraged him to continue using MiraLax and may try bisacodyl suppository if needed. Left buttock pain: Will rule out fracture with plain films of the left hip. Discussed using ibuprofen or Tylenol as needed and may consider icing for the next 48 hours. Hematoma of the head status post fall: Reassuring neuro exam therefore I think we can hold off on imaging at this point.  Discussed with patient and wife neurologic indications including signs and symptoms that would require urgent neuroimaging imaging. Patient has neurology appointment tomorrow, family declines physical therapy.  Return if symptoms worsen or fail to improve.

## 2012-09-17 NOTE — Telephone Encounter (Signed)
Pt wife notified of results and MD instructions. KG LPN

## 2012-09-17 NOTE — Telephone Encounter (Signed)
[name], Will you please let Mr. And Mrs. Larch know that his xray showed chronic osteoarthritis typical for men his age but no signs of a fracture from his fall yesterday.  I would recommend Ice and Ibuprophen for any discomfort.

## 2012-09-18 ENCOUNTER — Telehealth: Payer: Self-pay | Admitting: *Deleted

## 2012-09-18 NOTE — Telephone Encounter (Signed)
Called and lmovm informing [name] that [employer] paperwork is completed and is up front ready for p/u (no fax # on paperwork).[name], [name]

## 2012-09-25 ENCOUNTER — Ambulatory Visit (INDEPENDENT_AMBULATORY_CARE_PROVIDER_SITE_OTHER): Payer: Medicare Other | Admitting: Family Medicine

## 2012-09-25 DIAGNOSIS — Z111 Encounter for screening for respiratory tuberculosis: Secondary | ICD-10-CM

## 2012-09-25 DIAGNOSIS — Z23 Encounter for immunization: Secondary | ICD-10-CM

## 2012-09-25 NOTE — Progress Notes (Signed)
  Subjective:    Patient ID: [name], male    DOB: September 16, 1935, 77 y.o.   MRN: [id number] PPD injection.  Pt will return fri for read HPI    Review of Systems     Objective:   Physical Exam        Assessment & Plan:

## 2012-09-28 LAB — TB SKIN TEST
Induration: 0 mm
TB Skin Test: NEGATIVE

## 2012-10-13 ENCOUNTER — Telehealth: Payer: Self-pay | Admitting: Family Medicine

## 2012-10-13 NOTE — Telephone Encounter (Signed)
Pt demented and chronically ill, was just d/c'd from hospital 10/12/12.  HH nurse called today to say that family is overwhelmed and cannot care for him adequately at home.  I gave order to go ahead and have him brought back to ER for further evaluation.

## 2012-10-25 ENCOUNTER — Telehealth: Payer: Self-pay | Admitting: Family Medicine

## 2012-10-25 NOTE — Telephone Encounter (Signed)
Called and discussed that [name] is now on hospice at [facility] in [place].  He is doing ok. No really eating . They are having to sedate him bc of agitation.

## 2012-11-01 ENCOUNTER — Ambulatory Visit (HOSPITAL_COMMUNITY): Payer: Self-pay | Admitting: Psychiatry

## 2012-12-04 ENCOUNTER — Telehealth: Payer: Self-pay | Admitting: *Deleted

## 2012-12-04 NOTE — Telephone Encounter (Signed)
[name] called today to inform us that the patient's wife has taken him home from the facility he was in.  [name] is going to be his case Production designer, theatre/television/film for his home health.  The patient's wife asked if you would continue to be his doctor while he's home.  I spoke with [name] & we agreed that you would want to.  I asked mrs [name] to send in notes & current medications on [name].  Just wanted to let you know.

## 2012-12-05 ENCOUNTER — Telehealth: Payer: Self-pay | Admitting: *Deleted

## 2012-12-05 NOTE — Telephone Encounter (Signed)
Agree to v. O.

## 2012-12-05 NOTE — Telephone Encounter (Signed)
Spoke with Janann August from [facility] care today & gave verbal orders (per Dr. Linford Arnold) for Haldol 5 mg po q4hrs prn for agitation, seroquel 50mg  bid,  ambien 2.5mg  qhs prn for sleep, nystatin cream for yeast to the groin, suture removal on forehead, hydrogel with optifoam dressing for skin tears on both arms, open bottom shower chair, and foley catheter.

## 2012-12-12 ENCOUNTER — Telehealth: Payer: Self-pay | Admitting: *Deleted

## 2012-12-12 MED ORDER — LAMOTRIGINE 200 MG PO TABS: 200.0000 mg | ORAL_TABLET | Freq: Two times a day (BID) | ORAL | Status: DC

## 2012-12-12 NOTE — Telephone Encounter (Signed)
Nurse with Hospice calls and wanted to know if you could look at his med list and see if there is anything that they could discontinue. States they are having to try and consolidate medications of ones patients do not need. She said she sent a list to you

## 2012-12-12 NOTE — Telephone Encounter (Signed)
Pt also has thorazine and they have Haldol to use as needed. Can they get rid of the thorazine. On Colace as needed and he is not having diarrhea and see if they can change to Senna S.

## 2012-12-12 NOTE — Telephone Encounter (Signed)
Ok to stop the calcium with vit D.  Decrease iron to once a day. Decrease seroquel to 25mg  po BID (stop 2 tab BID). Change sertraline to 50mg  po QD (instead if 3 tabs if 25mg ).  Updated med list.

## 2012-12-12 NOTE — Telephone Encounter (Signed)
Hospice notified. Barry Dienes, LPN

## 2012-12-12 NOTE — Telephone Encounter (Signed)
Ok for both changes. D/C thorazine and change colace to senna S

## 2012-12-13 ENCOUNTER — Telehealth: Payer: Self-pay | Admitting: *Deleted

## 2012-12-13 NOTE — Telephone Encounter (Signed)
Pt notified. Damontae Loppnow, LPN  

## 2012-12-13 NOTE — Telephone Encounter (Signed)
Wife calls and states that she doesn't think she needs Hospice in the home anymore and that she needs all [facility]s [facility]. States she will make appointment for this if you need her to

## 2012-12-13 NOTE — Telephone Encounter (Signed)
Don't dismiss them yet, they can be a great resource and help.  Let definitely do a 30 min appt so we can assess where he is at at this point.

## 2012-12-14 ENCOUNTER — Ambulatory Visit (INDEPENDENT_AMBULATORY_CARE_PROVIDER_SITE_OTHER): Payer: Medicare Other | Admitting: Family Medicine

## 2012-12-14 ENCOUNTER — Ambulatory Visit: Payer: Self-pay | Admitting: Family Medicine

## 2012-12-14 ENCOUNTER — Encounter: Payer: Self-pay | Admitting: Family Medicine

## 2012-12-14 VITALS — BP 122/58 | HR 89 | Wt 127.0 lb

## 2012-12-14 DIAGNOSIS — M4800 Spinal stenosis, site unspecified: Secondary | ICD-10-CM

## 2012-12-14 DIAGNOSIS — F329 Major depressive disorder, single episode, unspecified: Secondary | ICD-10-CM

## 2012-12-14 DIAGNOSIS — R569 Unspecified convulsions: Secondary | ICD-10-CM

## 2012-12-14 DIAGNOSIS — F3289 Other specified depressive episodes: Secondary | ICD-10-CM

## 2012-12-14 DIAGNOSIS — E039 Hypothyroidism, unspecified: Secondary | ICD-10-CM

## 2012-12-14 DIAGNOSIS — M6281 Muscle weakness (generalized): Secondary | ICD-10-CM

## 2012-12-14 DIAGNOSIS — G309 Alzheimer's disease, unspecified: Secondary | ICD-10-CM

## 2012-12-14 DIAGNOSIS — F0280 Dementia in other diseases classified elsewhere without behavioral disturbance: Secondary | ICD-10-CM

## 2012-12-14 DIAGNOSIS — N139 Obstructive and reflux uropathy, unspecified: Secondary | ICD-10-CM

## 2012-12-14 MED ORDER — QUETIAPINE FUMARATE 25 MG PO TABS: 25.0000 mg | ORAL_TABLET | Freq: Two times a day (BID) | ORAL | Status: DC

## 2012-12-14 MED ORDER — SERTRALINE HCL 50 MG PO TABS: 50.0000 mg | ORAL_TABLET | Freq: Every day | ORAL | Status: DC

## 2012-12-14 MED ORDER — LEVOTHYROXINE SODIUM 100 MCG PO CAPS: 1.0000 | ORAL_CAPSULE | Freq: Every day | ORAL | Status: DC

## 2012-12-14 MED ORDER — FERROUS SULFATE 325 (65 FE) MG PO TABS: 325.0000 mg | ORAL_TABLET | Freq: Every day | ORAL | Status: DC

## 2012-12-14 MED ORDER — TAMSULOSIN HCL 0.4 MG PO CAPS: 0.8000 mg | ORAL_CAPSULE | Freq: Every day | ORAL | Status: DC

## 2012-12-14 MED ORDER — LAMOTRIGINE 200 MG PO TABS: 200.0000 mg | ORAL_TABLET | Freq: Two times a day (BID) | ORAL | Status: DC

## 2012-12-14 NOTE — Progress Notes (Signed)
Subjective:    Patient ID: [name], male    DOB: September 03, 1935, 77 y.o.   MRN: [id number]  HPI Patient is here with his wife and his caregiver today. He is going through a lot since I last saw him. He had prostate surgery and afterwards was actually hospitalized for quite some time. He was transferred to hospice through [facility]. He was there for almost a month and then they decided to discharge him to home hospice. Since being home he has actually really recovered quite well. Has a chronic foley catheter. He is eating at least 3 meals a day and snacks. His appetite has really increased. They have been trying to get him up around 7 or 7:30 in the morning given his medications and little bit of breakfast and then allow him to have a late morning nap. They've been trying to do more up during the day so that he'll sleep better at night. He still has dementia and is disoriented at times.    Hospice initially stopped his Lamictal nad he had 2 seizures. They only have one pill left for tomorrow and they're worried about running out. We did receive paperwork for prior authorization for his Lamictal. He is now walking some with assitance. He is able to verbally communicate his immediate desires. His wife feels that he no longer needs hospice and she now has some assistance in the home which is taken a large amount of stress off of her. I still feel like he is a little too sedated first thing in the morning.   Review of Systems     BP 122/58  Pulse 89  Wt 127 lb (57.607 kg)  BMI 19.31 kg/m2    Allergies  Allergen Reactions  . Atorvastatin     REACTION: Reaction not known  . Donepezil Hydrochloride     REACTION: sick    Past Medical History  Diagnosis Date  . CAD (coronary artery disease)     PCI of his LAD as well as his right coronary artery  . HTN (hypertension)   . HLD (hyperlipidemia)   . Diabetes mellitus   . Hypothyroidism   . Obstructive sleep apnea   . Dementia   . Seizure  disorder     Past Surgical History  Procedure Laterality Date  . Cholecystectomy    . Pci      right coronary and left anterior descending  . Posterior laminectomy / decompression cervical spine      History   Social History  . Marital Status: Married    Spouse Name: N/A    Number of Children: N/A  . Years of Education: N/A   Occupational History  . Not on file.   Social History Main Topics  . Smoking status: Former Games developer  . Smokeless tobacco: Not on file     Comment: Quit in 1990.   Marland Kitchen Alcohol Use: No  . Drug Use: No  . Sexually Active:    Other Topics Concern  . Not on file   Social History Narrative   No caffeine, unable to walk by himself outside the home. Uses a walker.    No family history on file.  Outpatient Encounter Prescriptions as of 12/14/2012  Medication Sig Dispense Refill  . ferrous sulfate 325 (65 FE) MG tablet Take 1 tablet (325 mg total) by mouth daily with breakfast.  30 tablet  4  . ibuprofen (ADVIL,MOTRIN) 400 MG tablet Take 400 mg by mouth every 6 (six) hours as  needed for pain.      Marland Kitchen lamoTRIgine (LAMICTAL) 200 MG tablet Take 1 tablet (200 mg total) by mouth 2 (two) times daily.  60 tablet  4  . Levothyroxine Sodium 100 MCG CAPS Take 1 capsule (100 mcg total) by mouth daily before breakfast.  30 capsule  4  . QUEtiapine (SEROQUEL) 25 MG tablet Take 1 tablet (25 mg total) by mouth 2 (two) times daily.  60 tablet  4  . sertraline (ZOLOFT) 50 MG tablet Take 1 tablet (50 mg total) by mouth daily.  30 tablet  4  . tamsulosin (FLOMAX) 0.4 MG CAPS Take 2 capsules (0.8 mg total) by mouth at bedtime.  30 capsule  4  . [DISCONTINUED] ferrous sulfate 325 (65 FE) MG tablet Take 325 mg by mouth daily with breakfast.      . [DISCONTINUED] lamoTRIgine (LAMICTAL) 200 MG tablet Take 1 tablet (200 mg total) by mouth 2 (two) times daily.  60 tablet  2  . [DISCONTINUED] Levothyroxine Sodium 100 MCG CAPS Take 1 capsule by mouth daily before breakfast.      .  [DISCONTINUED] QUEtiapine (SEROQUEL) 25 MG tablet Take 25 mg by mouth 2 (two) times daily.      . [DISCONTINUED] sertraline (ZOLOFT) 50 MG tablet Take 50 mg by mouth daily.      . [DISCONTINUED] Tamsulosin HCl (FLOMAX) 0.4 MG CAPS Take 0.8 mg by mouth at bedtime.         No facility-administered encounter medications on file as of 12/14/2012.       Objective:   Physical Exam  Constitutional: He is oriented to person, place, and time. He appears well-developed.  Thin, frail-appearing male who appears older than stated age.  HENT:  Head: Normocephalic and atraumatic.  He does have some bruises and small superficial lacerations on his forearms.  Neck: Neck supple. No thyromegaly present.  Cardiovascular: Normal rate, regular rhythm and normal heart sounds.   Pulmonary/Chest: Effort normal and breath sounds normal.  Abdominal: Soft. Bowel sounds are normal. He exhibits no distension and no mass. There is no tenderness. There is no rebound and no guarding.  Musculoskeletal: He exhibits no edema.  Lymphadenopathy:    He has no cervical adenopathy.  Neurological: He is alert and oriented to person, place, and time.  Skin: Skin is warm and dry.  Psychiatric: He has a normal mood and affect. His behavior is normal.          Assessment & Plan:  Depression-I. would like to try decreasing his steroid down to 25 mg twice a day it was previously 50 mg twice a day. His wife is to monitor for any significant new changes on the decrease. If he is doing well then we can continue to wean the drug. I would also like to decrease the sertraline down from 75 mg to 50 mg. Please see prior from it. Again I want his family to keep an eye on any new changes with the decreased dose.  Seizure disorder-he does need to be continued on Lamictal. We worked on trying to get a prior authorization for this. Next  Hypothyroidism-I. did refill his thyroid medication today. The make sure the separate the dose from his  oral iron.  I do think it would be okay for [place] to discontinue hospice at this point. He has really made some major strides in getting back to his baseline for the last 2 weeks. I think is certainly reasonable based on his current status to  discontinue hospice care.  His family does feel that he would strongly benefit from continued his hospice bed. It is low to the floor which is perfect in case he tries to get out of the bed or falls out of i,t he is less likely to harm himself. He also has side will set provide some protection. It also has a gel cushion for comfort and support especially with his gait abnormality and history of spinal stenosis. I will be happy to write a letter in support of his continued use of the hospice bed.  Urinary obstruction-we will need to follow up with his urologist to decide to take the urine Foley catheter out or not. I certainly think it would be worth a trial to see if he could do without it especially now that he is more mobile and will be able to make it to the bathroom with some assistance.  Detailed letter to have the hospice bed that is low and has a Gel overlay for comfort.   Time spetn 40 min with > 50% spent counseling about medications and his need for hospice.

## 2012-12-18 ENCOUNTER — Encounter: Payer: Self-pay | Admitting: Family Medicine

## 2012-12-18 ENCOUNTER — Telehealth: Payer: Self-pay | Admitting: *Deleted

## 2012-12-18 ENCOUNTER — Telehealth (HOSPITAL_COMMUNITY): Payer: Self-pay

## 2012-12-18 MED ORDER — SERTRALINE HCL 50 MG PO TABS: 50.0000 mg | ORAL_TABLET | Freq: Every day | ORAL | Status: DC

## 2012-12-18 NOTE — Telephone Encounter (Signed)
Check with Selena Batten, but I think we were working on a prior authorization for his Seroquel.

## 2012-12-18 NOTE — Telephone Encounter (Signed)
Spoke with [employer] about [name]'s medications.  According to [facility], hospice hasn't fully released him as a pt.  Some of his medications were approved under hospice but the Seroquel was denied.  I asked what we could do to get this man his medications and was told that we have to wait for hospice to update medicare as to him no longer being under their care. Just wanted to update you.

## 2012-12-19 ENCOUNTER — Telehealth: Payer: Self-pay | Admitting: *Deleted

## 2012-12-19 MED ORDER — SERTRALINE HCL 50 MG PO TABS: 50.0000 mg | ORAL_TABLET | Freq: Every day | ORAL | Status: DC

## 2012-12-19 NOTE — Telephone Encounter (Signed)
lvm for social worker to call back to obtain information to have pt released from their care.[name]

## 2012-12-19 NOTE — Telephone Encounter (Signed)
Done

## 2012-12-21 ENCOUNTER — Telehealth: Payer: Self-pay | Admitting: *Deleted

## 2012-12-21 ENCOUNTER — Other Ambulatory Visit: Payer: Self-pay | Admitting: *Deleted

## 2012-12-21 MED ORDER — METFORMIN HCL 500 MG PO TABS: 500.0000 mg | ORAL_TABLET | Freq: Two times a day (BID) | ORAL | Status: DC

## 2012-12-21 NOTE — Telephone Encounter (Signed)
Metformin sent to [facility].

## 2012-12-21 NOTE — Telephone Encounter (Signed)
Ok to refill 

## 2012-12-21 NOTE — Telephone Encounter (Signed)
Patient's wife calls & states that now that [name] is eating he needs his metformin again.  His sugar this morning was 290 after breakfast.  [facility].

## 2013-01-01 ENCOUNTER — Telehealth: Payer: Self-pay | Admitting: *Deleted

## 2013-01-01 MED ORDER — ZOLPIDEM TARTRATE 5 MG PO TABS: 5.0000 mg | ORAL_TABLET | Freq: Every evening | ORAL | Status: DC | PRN

## 2013-01-01 NOTE — Telephone Encounter (Signed)
rx sent. See if he is completely off the seroquel? If so then please remove from chart. Thank you.

## 2013-01-01 NOTE — Telephone Encounter (Signed)
[name] calls & states that [name] was on ambien 5mg  .5 tab qhs prn under hospice care.  He no longer has any and needs rx [facility].  Thanks

## 2013-01-01 NOTE — Telephone Encounter (Signed)
Patient's wife notified of rx sent. She states that he is currently taking the seroquel.

## 2013-01-14 ENCOUNTER — Encounter: Payer: Self-pay | Admitting: Family Medicine

## 2013-01-14 ENCOUNTER — Ambulatory Visit (INDEPENDENT_AMBULATORY_CARE_PROVIDER_SITE_OTHER): Payer: BC Managed Care – PPO | Admitting: Family Medicine

## 2013-01-14 VITALS — BP 128/66 | HR 63 | Wt 132.0 lb

## 2013-01-14 DIAGNOSIS — E119 Type 2 diabetes mellitus without complications: Secondary | ICD-10-CM

## 2013-01-14 DIAGNOSIS — F329 Major depressive disorder, single episode, unspecified: Secondary | ICD-10-CM

## 2013-01-14 DIAGNOSIS — G4700 Insomnia, unspecified: Secondary | ICD-10-CM

## 2013-01-14 DIAGNOSIS — G309 Alzheimer's disease, unspecified: Secondary | ICD-10-CM

## 2013-01-14 DIAGNOSIS — I10 Essential (primary) hypertension: Secondary | ICD-10-CM

## 2013-01-14 NOTE — Progress Notes (Addendum)
  Subjective:    Patient ID: [name], male    DOB: 09/25/1935, 77 y.o.   MRN: [id number]  HPI Saw the Urologist and her recommend to keep the catheter. Stopped the flomax.    Depression-overall mood has been well controlled. We were able to decrease Tanner Seroquel 25 mg at bedtime and continue the Zoloft 50 mg. This is been a good regimen so far. He is also getting Ambien 5 mg at bedtime for insomnia. And [name] is happy with this regimen. Overall he does well during the day but does get a little bit of sundowning in the evening he gets a little bit more irritable and hard to deal with. He has been sleeping well.  Hypothyroidism-tolerating the levothyroxine well.    Diabetes-tolerating metformin well. He's taking it twice a day. No hypoglycemic events. No wounds that are not healing well.  Review of Systems     Objective:   Physical Exam  Constitutional: He is oriented to person, place, and time. He appears well-developed and well-nourished.  HENT:  Head: Normocephalic and atraumatic.  Cardiovascular: Normal rate, regular rhythm and normal heart sounds.   Pulmonary/Chest: Effort normal and breath sounds normal.  Neurological: He is alert and oriented to person, place, and time.  Skin: Skin is warm and dry.  Psychiatric: He has a normal mood and affect. Tanner behavior is normal.          Assessment & Plan:  Depression-currently well controlled with sertraline and cervical. I offered to try to discontinue this report to see if he would do well without it. [name] was hesitant and wants to leave things where they are and reevaluate at followup appointment.  Diabetes-due for hemoglobin A1c today. We'll check this. Also make sure that Tanner kidney function is stable since he is taking metformin. Call if any lows or hypoglycemic events.  Insomnia-continue Ambien 5 mg at bedtime. This does help him rest and he wakes up well rested in the morning and more oriented.  Does have a  caretaker in the home and she is here with him today as well as [name]. Mrs. been a good support for [name] who has been Tanner primary caretaker for several years.  Iron deficiency anemia-he is still taking Tanner iron supplements without any side effects of abdominal pain or constipation.  Hypertension-blood pressure looks perfect today. He denies any chest pain or shortness of breath.   [name], was previously on Hospice for progressive dementia, failure to thrive, generalized weakness. He has actually been able to recover somewhat. Though,  he is not completely back to Tanner baseline. He is only semi-ambulatory and has significant problems with gait. I am specifically requesting that [name] have a semi-electric bed with an air matress. He needs frequent repositioning in a way that can not be achieved with a regular bed.   I strongly feel that he would benefit from being able to use the hospice bed for safety and for comfort. He has progressive dementia, failure to thrive, generalized weakness, and gait instability from Tanner spinal stenosis, and frequent falls. He also has a seizure disorder so having a bed low to ground with safety rails is important.

## 2013-01-15 ENCOUNTER — Telehealth: Payer: Self-pay | Admitting: *Deleted

## 2013-01-15 LAB — COMPLETE METABOLIC PANEL WITH GFR
Albumin: 3.4 g/dL — ABNORMAL LOW (ref 3.5–5.2)
BUN: 13 mg/dL (ref 6–23)
Calcium: 9.3 mg/dL (ref 8.4–10.5)
Chloride: 99 mEq/L (ref 96–112)
GFR, Est African American: >89 mL/min
GFR, Est Non African American: 87 mL/min
Glucose, Bld: 107 mg/dL — ABNORMAL HIGH (ref 70–99)
Potassium: 4.1 mEq/L (ref 3.5–5.3)

## 2013-01-15 LAB — HEMOGLOBIN A1C: Hgb A1c MFr Bld: 5.6 % (ref <5.7)

## 2013-01-15 NOTE — Telephone Encounter (Signed)
Victorino Dike the caregiver for patient calls this morning and states at 7:00am she gave pt his Lamictal 200mg  and his thyroid med.  Then his wife woke up and did not realize he had his am meds and gave him another Lamictal 200mg  but careegiver stopped her before giving the thyroid med.  Spoke with Dr. Linford Arnold and per her instructions advised caregiver to hold this evenings dose and patinet may experience increased drowsiness, H/A, dizziness and nausea but doctor said he should be fine but take to ED if notice any emergency symptoms. Barry Dienes, LPN

## 2013-02-06 ENCOUNTER — Telehealth: Payer: Self-pay | Admitting: *Deleted

## 2013-02-06 MED ORDER — CHLORPROMAZINE HCL 25 MG PO TABS: 25.0000 mg | ORAL_TABLET | Freq: Every day | ORAL | Status: DC | PRN

## 2013-02-06 NOTE — Telephone Encounter (Signed)
Patsy left a message today stating that [name] was taking chlorpromazine 25mg  (?) for agitation but doesn't have it now & needs rx sent in.  She states that he needs this in the afternoons.

## 2013-02-06 NOTE — Telephone Encounter (Signed)
Patient's wife notified of med sent to pharmacy.

## 2013-02-06 NOTE — Telephone Encounter (Signed)
[facility]

## 2013-02-14 ENCOUNTER — Other Ambulatory Visit: Payer: Self-pay

## 2013-03-20 ENCOUNTER — Other Ambulatory Visit: Payer: Self-pay | Admitting: *Deleted

## 2013-03-20 MED ORDER — METFORMIN HCL 500 MG PO TABS: 500.0000 mg | ORAL_TABLET | Freq: Two times a day (BID) | ORAL | Status: DC

## 2013-03-21 ENCOUNTER — Other Ambulatory Visit: Payer: Self-pay | Admitting: Family Medicine

## 2013-03-25 ENCOUNTER — Telehealth: Payer: Self-pay

## 2013-03-25 NOTE — Telephone Encounter (Signed)
Patient spouse called wanted to know if she could get a Rx for Docusate sodium 100 mg called in to pharmacy and she also wants to know about air mattress she requested for patient.

## 2013-03-26 MED ORDER — DOCUSATE SODIUM 100 MG PO TABS: 100.0000 mg | ORAL_TABLET | Freq: Two times a day (BID) | ORAL | Status: DC | PRN

## 2013-03-26 NOTE — Addendum Note (Signed)
Addended by: Nani Gasser D on: 03/26/2013 08:02 AM   Modules accepted: Orders

## 2013-03-26 NOTE — Telephone Encounter (Signed)
Patient spouse stated that she has the hospice bed it has a gel mattress which is uncomfortable for the patient she wants to know if you can do a letter for a air mattress.

## 2013-03-26 NOTE — Telephone Encounter (Signed)
[facility] As far as the air mattress is concerned I am not sure. We did write a letter to hospice back in May asking for them to be able to keep the hospice bed. Is this what she is talking about or is this different?

## 2013-03-26 NOTE — Telephone Encounter (Signed)
Letter printed.

## 2013-03-27 ENCOUNTER — Other Ambulatory Visit: Payer: Self-pay | Admitting: Family Medicine

## 2013-03-27 ENCOUNTER — Telehealth: Payer: Self-pay

## 2013-03-27 NOTE — Telephone Encounter (Signed)
[facility] needs a Rx for the air mattress. And she request that the semi electric bed be put on a Rx as well. Due to the patient not being being hospice he will not qualify for a full electric bed. Note needs to say patient needs frequent reposition in a way that can not be achieved with a regular bed. If the patient has any pressure ulcers please document that in letter as well. Document it on OV note letter instead of letter.

## 2013-03-29 ENCOUNTER — Encounter: Payer: Self-pay | Admitting: Family Medicine

## 2013-03-29 MED ORDER — AMBULATORY NON FORMULARY MEDICATION: Status: DC

## 2013-03-29 NOTE — Telephone Encounter (Signed)
All faxed to [facility]. Barry Dienes, LPN

## 2013-03-29 NOTE — Telephone Encounter (Signed)
Note from June addended.  rx printed.

## 2013-03-29 NOTE — Addendum Note (Signed)
Addended by: Nani Gasser D on: 03/29/2013 01:39 PM   Modules accepted: Orders

## 2013-05-07 ENCOUNTER — Other Ambulatory Visit: Payer: Self-pay | Admitting: Family Medicine

## 2013-05-16 ENCOUNTER — Other Ambulatory Visit: Payer: Self-pay | Admitting: Family Medicine

## 2013-05-17 ENCOUNTER — Encounter (HOSPITAL_COMMUNITY): Payer: Self-pay | Admitting: Psychiatry

## 2013-05-17 ENCOUNTER — Encounter (INDEPENDENT_AMBULATORY_CARE_PROVIDER_SITE_OTHER): Payer: Self-pay

## 2013-05-17 ENCOUNTER — Ambulatory Visit (INDEPENDENT_AMBULATORY_CARE_PROVIDER_SITE_OTHER): Payer: BC Managed Care – PPO | Admitting: Psychiatry

## 2013-05-17 VITALS — BP 112/70 | Ht 68.0 in | Wt 142.0 lb

## 2013-05-17 DIAGNOSIS — F329 Major depressive disorder, single episode, unspecified: Secondary | ICD-10-CM

## 2013-05-17 DIAGNOSIS — F331 Major depressive disorder, recurrent, moderate: Secondary | ICD-10-CM

## 2013-05-17 DIAGNOSIS — F0280 Dementia in other diseases classified elsewhere without behavioral disturbance: Secondary | ICD-10-CM

## 2013-05-17 MED ORDER — SERTRALINE HCL 100 MG PO TABS: 200.0000 mg | ORAL_TABLET | Freq: Every day | ORAL | Status: DC

## 2013-05-17 MED ORDER — QUETIAPINE FUMARATE 25 MG PO TABS: ORAL_TABLET | ORAL | Status: DC

## 2013-05-17 NOTE — Progress Notes (Signed)
   [facility] Follow-up Outpatient Visit  [name] 03-17-1936   Subjective: The patient is a 77 year old male who is been followed by [facility] since January of 2011. He is currently diagnosed with Alzheimer's dementia and major depressive disorder. At his last appointment, I did not make any changes. I HAVE not seen him since December. At last appointment, he has been in and out of hospice.   He is now back home with caregivers. The caregivers presents today with his appointment along with his wife. The patient has lost approximately 15 pounds since last time I saw him. They're main issue right now is that he's agitated at night. He doesn't sleep. He will get up and want to go back to bed. He cannot ambulate on his own and is wheelchair-bound. He continues to be confused. He will physically shakes during the day. he is having a suprapubic catheter placed this week. His wife wants to keep him home as long as he can. While he was in hospice, he was given Ambien at night. He was also given Haldol and Thorazine as needed. He did have an episode last week where they thought he had a stroke. He was taken to the ED, but his CT was negative. He is falling asleep in today's appointment. Caregiver's report it because he did not sleep last night.  Filed Vitals:   05/17/13 1356  BP: 112/70    Mental Status Examination  Appearance: Casually dressed Alert: Yes Attention: fair  Cooperative: Yes Eye Contact: Fair Speech: Some hesitancy Psychomotor Activity: Decreased, very unstable gait. Wide-based. Memory/Concentration: Poor memory especially for short-term events Oriented: person, place and situation Mood: Euthymic Affect: Restricted Thought Processes and Associations: Linear Fund of Knowledge: Fair Thought Content: No suicidal or homicidal thoughts Insight: Fair Judgement:  Poor  Diagnosis: Alzheimer's dementia, MDD  Treatment Plan: I will increase Seroquel  at 25 mg in the morning and 50 mg at bedtime. I will continue the Zoloft 200 mg daily. Patient may use Benadryl also for sleep. Wife will give me an update in 10 days. The patient will see Dr. Laury Deep in approximately 2 weeks. He is to receive no more Ambien, Haldol or Thorazine. Jamse Mead, MD

## 2013-05-26 ENCOUNTER — Other Ambulatory Visit: Payer: Self-pay | Admitting: Family Medicine

## 2013-05-28 ENCOUNTER — Ambulatory Visit (HOSPITAL_COMMUNITY): Payer: Self-pay | Admitting: Psychiatry

## 2013-06-12 ENCOUNTER — Other Ambulatory Visit: Payer: Self-pay | Admitting: Family Medicine

## 2013-06-13 IMAGING — CR DG HIP (WITH OR WITHOUT PELVIS) 2-3V*L*
3 series · 3 of 3 positions shown · non-contrast
Comparison: None.

CLINICAL DATA: Left hip pain after a fall.

LEFT HIP - COMPLETE 2+ VIEW

[view not recorded (1 of 3)]
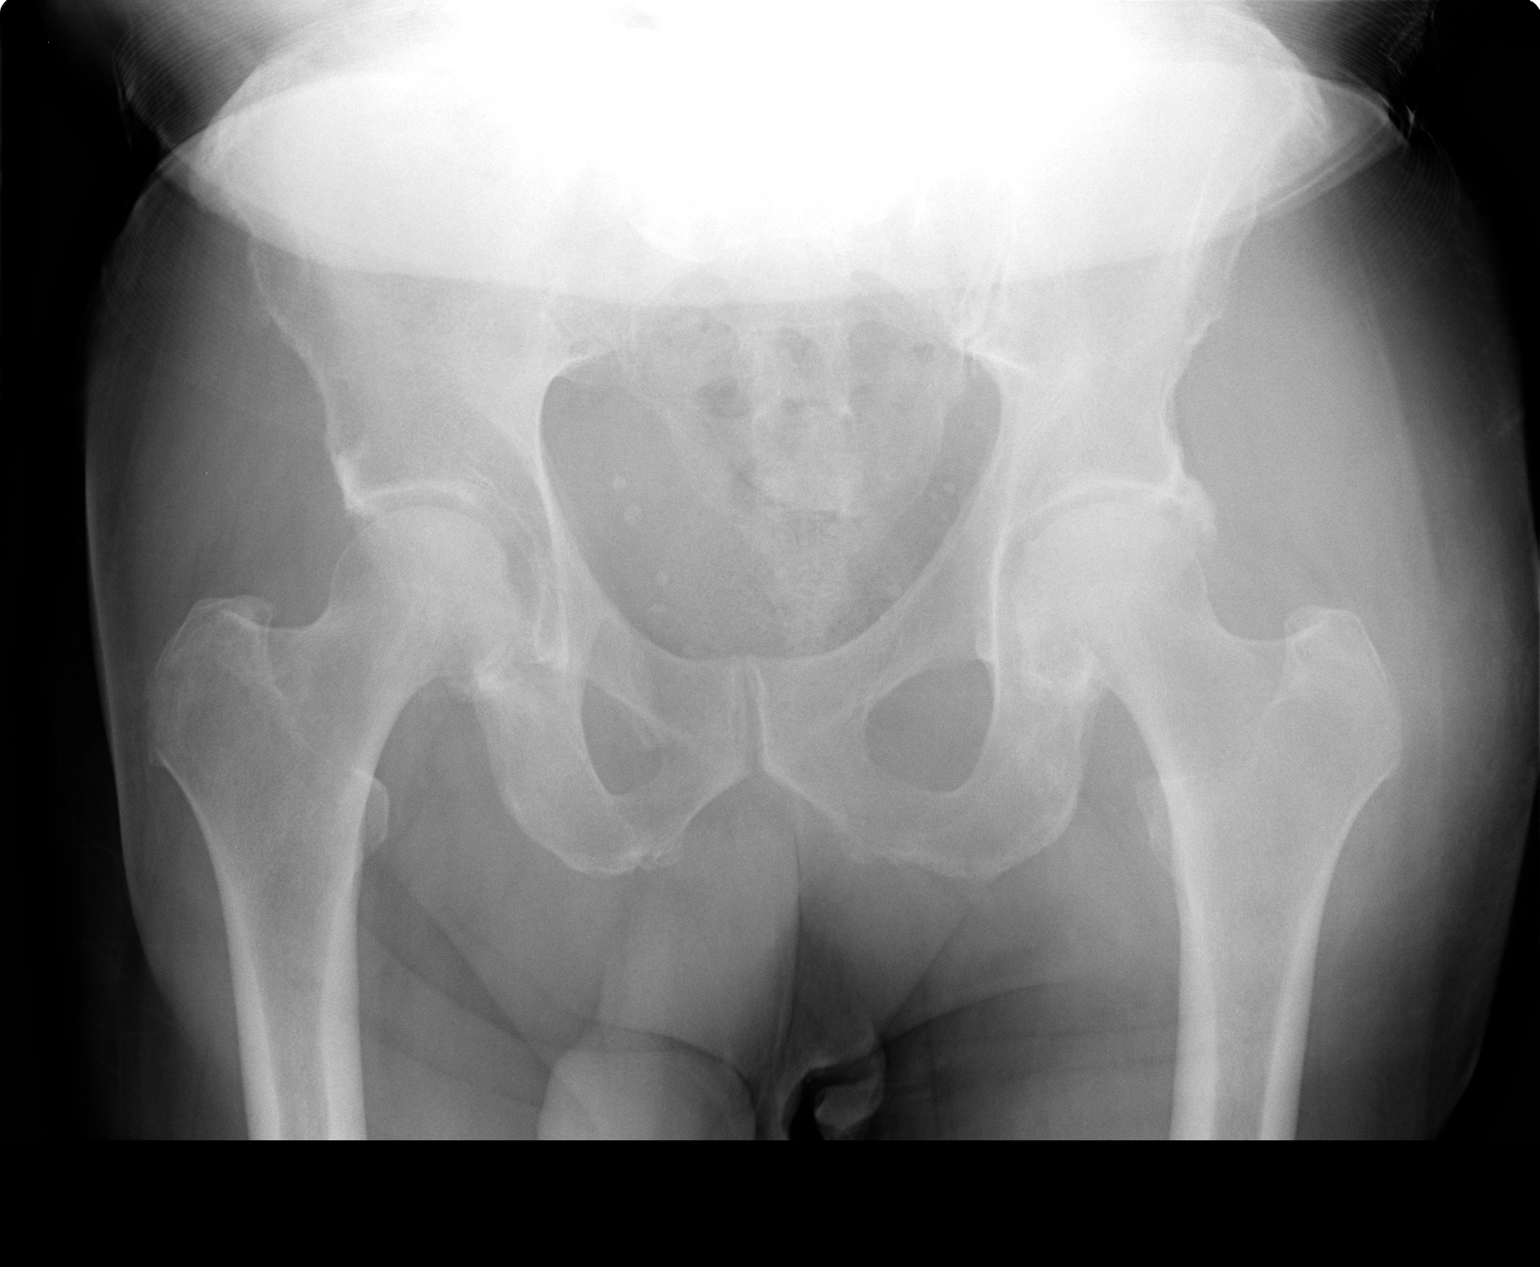

[view not recorded (2 of 3)]
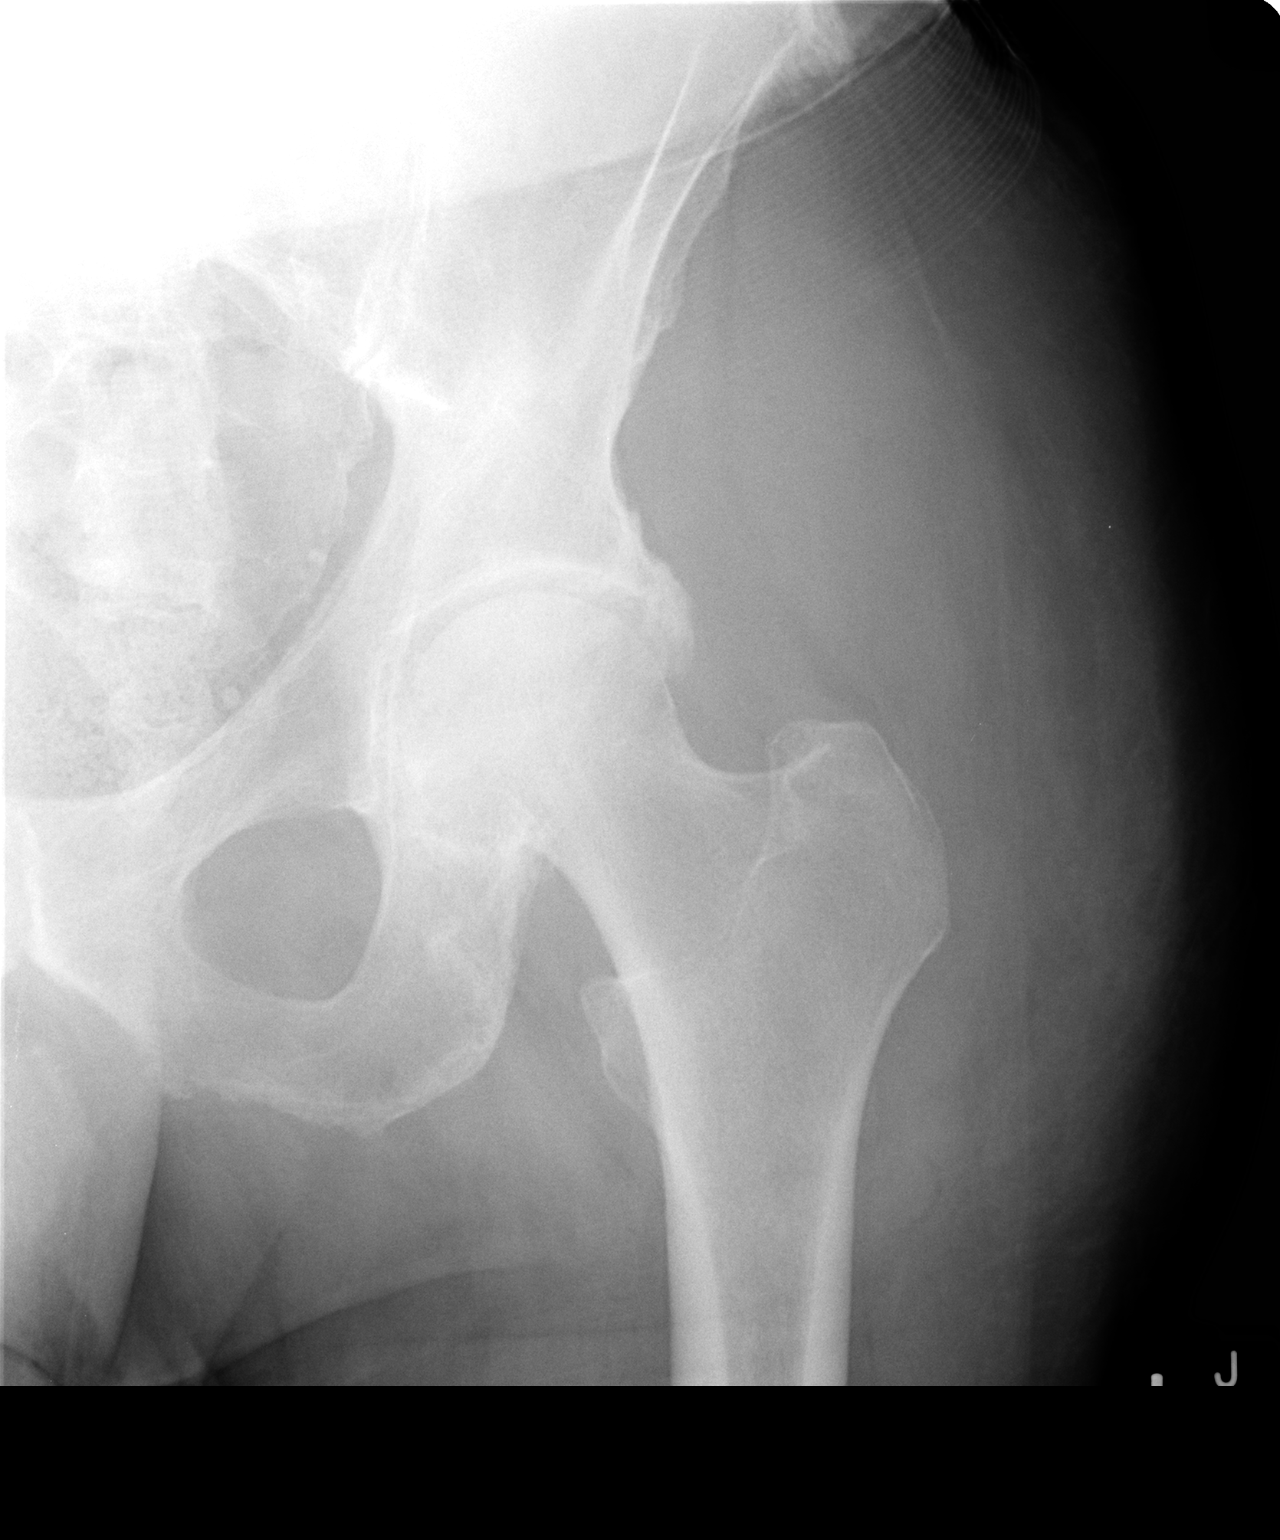

[view not recorded (3 of 3)]
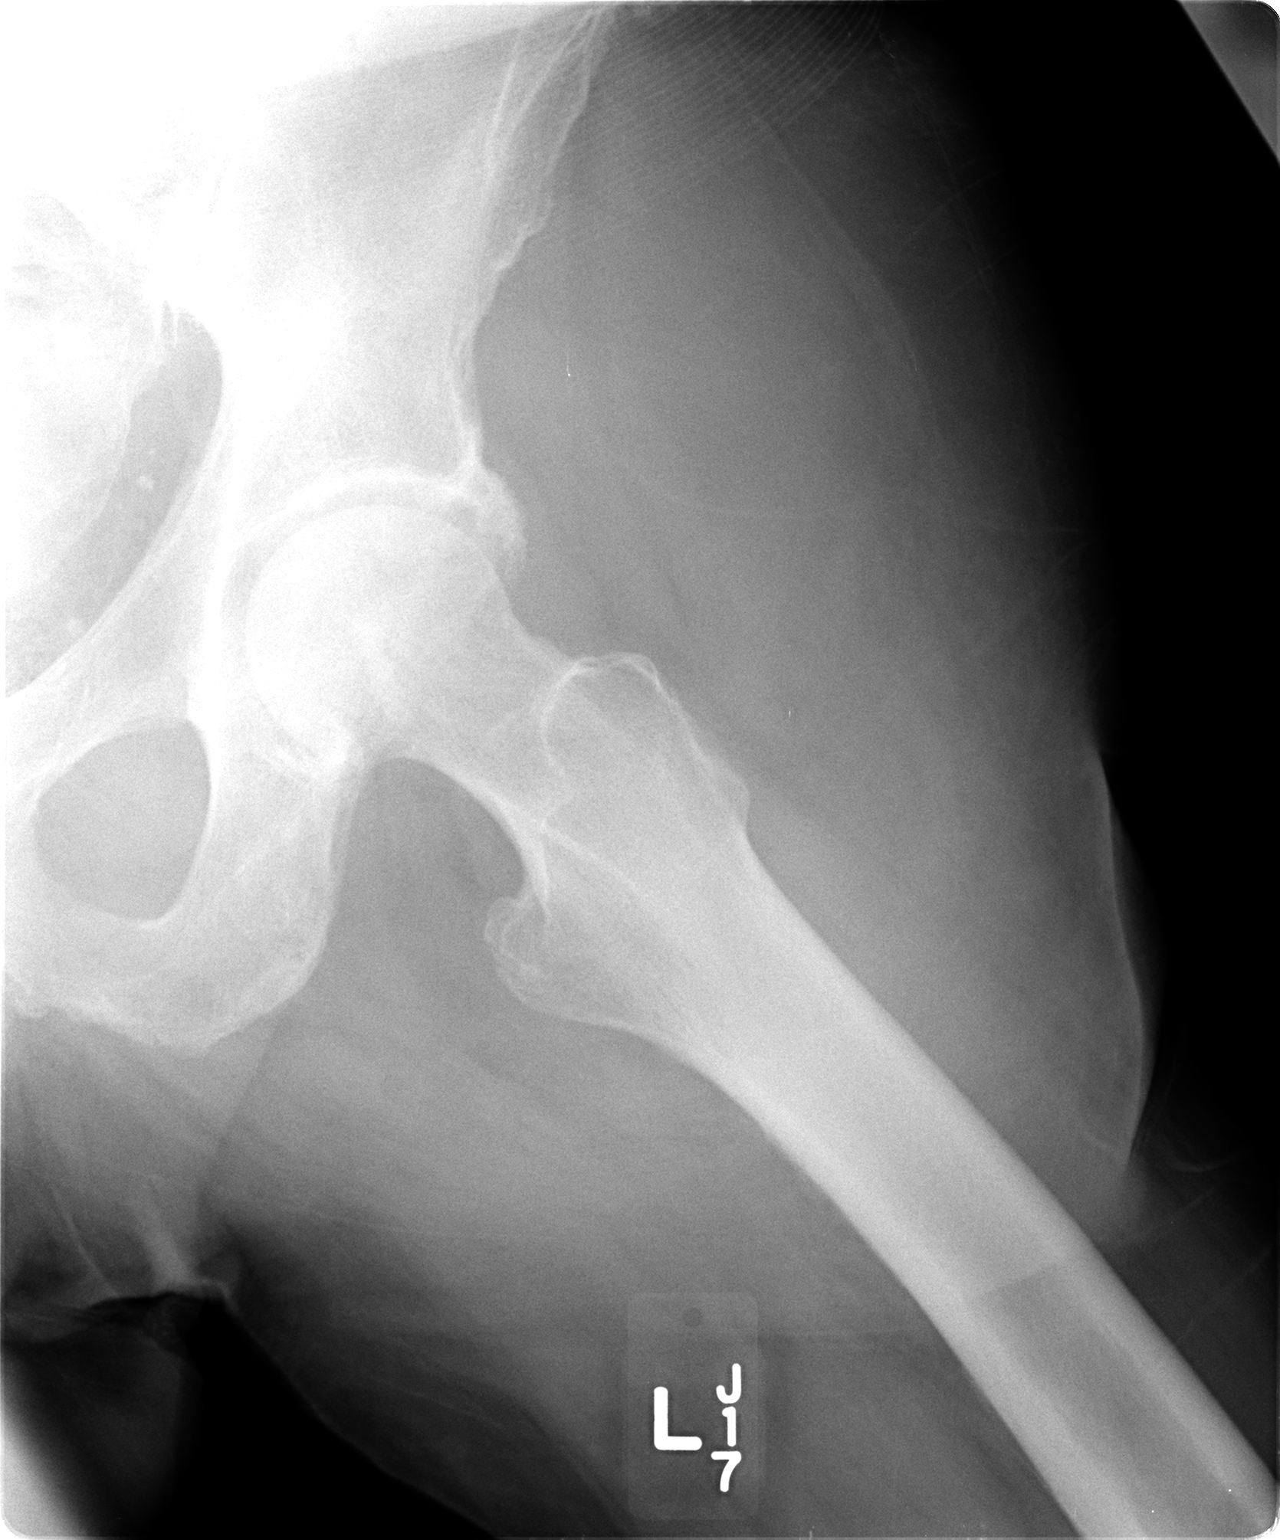

[3 of 3 positions shown; findings below may reference images not displayed]

FINDINGS: No fracture or dislocation.  Mild subchondral sclerosis
and osteophytosis in both hips.  Hip joint space appears maintained
bilaterally.
IMPRESSION: Mild bilateral hip osteoarthritis.  No acute findings.

## 2013-06-15 ENCOUNTER — Other Ambulatory Visit: Payer: Self-pay | Admitting: Family Medicine

## 2013-06-19 ENCOUNTER — Ambulatory Visit (INDEPENDENT_AMBULATORY_CARE_PROVIDER_SITE_OTHER): Payer: Medicare Other | Admitting: Family Medicine

## 2013-06-19 ENCOUNTER — Ambulatory Visit (INDEPENDENT_AMBULATORY_CARE_PROVIDER_SITE_OTHER): Payer: Medicare Other | Admitting: Psychiatry

## 2013-06-19 ENCOUNTER — Encounter (INDEPENDENT_AMBULATORY_CARE_PROVIDER_SITE_OTHER): Payer: Self-pay

## 2013-06-19 ENCOUNTER — Encounter (HOSPITAL_COMMUNITY): Payer: Self-pay | Admitting: Psychiatry

## 2013-06-19 VITALS — BP 104/50 | HR 76 | Ht 68.0 in

## 2013-06-19 DIAGNOSIS — F329 Major depressive disorder, single episode, unspecified: Secondary | ICD-10-CM

## 2013-06-19 DIAGNOSIS — F0280 Dementia in other diseases classified elsewhere without behavioral disturbance: Secondary | ICD-10-CM

## 2013-06-19 DIAGNOSIS — Z23 Encounter for immunization: Secondary | ICD-10-CM

## 2013-06-19 NOTE — Progress Notes (Signed)
[facility] [address] Progress Note  [name] [id number] 77 y.o.  06/19/2013 11:24 AM  Chief Complaint: Follow up.  History of Present Illness: HPI Comments: Mr.  is  a 25o fe/male with a past psychiatric history significant for Schizoaffective Disorder. The patient is referred for psychiatric services for psychiatric evaluation and medication management.    . Location: Patient is doing well overall. He denies any further irritability. . Quality: In the area of affective symptoms, patient appears euthymic. Patient denies current suicidal ideation, intent, or plan. Patient denies current homicidal ideation, intent, or plan. Patient denies auditory hallucinations. Patient denies visual hallucinations. Patient denies symptoms of paranoia. Patient states sleep is good, with approximately 12 hours of sleep total. Appetite is good. Energy level is fair. Patient denies symptoms of anhedonia. Patient denies hopelessness, helplessness, or guilt.  . Severity: Depression: 5/10 (0=Very depressed; 5=Neutral; 10=Very Happy)  Anxiety- 5/10 (0=no anxiety; 5= moderate/tolerable anxiety; 10= panic attacks) . Duration: 1998  . Timing: Worse in the evening  . Context: Physical limitations.  . Modifying factors: Improves with watching football, Chocolate and coffee.  . Associated signs and symptoms:  As noted in review of psychiatric symptoms.   Suicidal Ideation: Negative Plan Formed: Negative Patient has means to carry out plan: Negative  Homicidal Ideation: Negative Plan Formed: Negative Patient has means to carry out plan: Negative  Review of Systems: Psychiatric: Agitation: Reduced Hallucination: Negative Depressed Mood: Negative Insomnia: Yes Hypersomnia: Negative Altered Concentration: Negative Feels Worthless: Negative Grandiose Ideas: Negative Belief In Special Powers: Negative New/Increased Substance Abuse: Negative Compulsions: Negative  Neurologic: Headache:  Negative Seizure: Yes Paresthesias: No  Past Medical Family, Social History:  Past Medical History  Diagnosis Date  . CAD (coronary artery disease)     PCI of his LAD as well as his right coronary artery  . HTN (hypertension)   . HLD (hyperlipidemia)   . Diabetes mellitus   . Hypothyroidism   . Obstructive sleep apnea   . Dementia   . Seizure disorder    Family History  Problem Relation Age of Onset  . Dementia Mother    History   Social History  . Marital Status: Married    Spouse Name: N/A    Number of Children: N/A  . Years of Education: N/A   Social History Main Topics  . Smoking status: Former Games developer  . Smokeless tobacco: None     Comment: Quit in 1990.   Marland Kitchen Alcohol Use: No  . Drug Use: No  . Sexual Activity: None   Other Topics Concern  . None   Social History Narrative   Unable to walk by himself outside the home. Uses a walker.     .  Outpatient Encounter Prescriptions as of 06/19/2013  Medication Sig  . AMBULATORY NON FORMULARY MEDICATION Medication Name: An electric bed with air mattress and rails. [facility]  . Docusate Sodium 100 MG capsule Take 1 tablet (100 mg total) by mouth 2 (two) times daily as needed for constipation.  . ferrous sulfate 325 (65 FE) MG tablet TAKE 1 TABLET (325 MG TOTAL) BY MOUTH DAILY WITH BREAKFAST.  Marland Kitchen ibuprofen (ADVIL,MOTRIN) 400 MG tablet Take 400 mg by mouth every 6 (six) hours as needed for pain.  Marland Kitchen lamoTRIgine (LAMICTAL) 200 MG tablet TAKE 1 TABLET (200 MG TOTAL) BY MOUTH 2 (TWO) TIMES DAILY.  Marland Kitchen levothyroxine (SYNTHROID, LEVOTHROID) 88 MCG tablet TAKE 1 TABLET BY MOUTH EVERY DAY  . Levothyroxine Sodium 100 MCG CAPS Take  1 capsule (100 mcg total) by mouth daily before breakfast.  . metFORMIN (GLUCOPHAGE) 500 MG tablet Take 1 tablet (500 mg total) by mouth 2 (two) times daily with a meal.  . metFORMIN (GLUCOPHAGE) 500 MG tablet TAKE 1 TABLET (500 MG TOTAL) BY MOUTH 2 (TWO) TIMES DAILY WITH A MEAL.  . metFORMIN  (GLUCOPHAGE) 500 MG tablet TAKE 1 TABLET (500 MG TOTAL) BY MOUTH 2 (TWO) TIMES DAILY WITH A MEAL.  Marland Kitchen QUEtiapine (SEROQUEL) 25 MG tablet Take one in the morning and 2 at bedtime  . sertraline (ZOLOFT) 100 MG tablet Take 2 tablets (200 mg total) by mouth daily.    Past Psychiatric History/Hospitalization(s): Anxiety: Negative Bipolar Disorder: Negative Depression: Negative Mania: Negative Psychosis: Negative Schizophrenia: Negative Personality Disorder: Negative Hospitalization for psychiatric illness: Negative History of Electroconvulsive Shock Therapy: Negative Prior Suicide Attempts: Negative  Physical Exam:  Constitutional:  BP 104/50  Pulse 76  Ht 5\' 8"  (1.727 m)  General Appearance: alert, oriented, no acute distress  Musculoskeletal: Strength & Muscle Tone: within normal limits-with the exception of weakness in his lower limbs bilaterally Gait & Station: In a wheelchair and unable to stand without support. Patient leans: N/A  General Appearance: Casual and Fairly Groomed  Patent attorney::  Poor  Speech:  Clear and Coherent and Normal Rate  Volume:  Normal  Mood: "good" Depression: 5/10 (0=Very depressed; 5=Neutral; 10=Very Happy)  Anxiety- 5/10 (0=no anxiety; 5= moderate/tolerable anxiety; 10= panic attacks)   Affect:  Appropriate and Congruent  Thought Process:  Circumstantial, Coherent and Linear  Orientation:  Full (Time, Place, and Person)  Thought Content:  WDL  Suicidal Thoughts:  No  Homicidal Thoughts:  No  Memory:  Immediate;   Good Recent;   Fair Remote;   Poor  Judgement:  Fair  Insight:  Good  Psychomotor Activity:  Normal  Concentration:  Fair  Akathisia:  Negative  Handed:  Right  AIMS (if indicated):  As noted in chart   Assets:  Communication Skills Desire for Improvement Financial Resources/Insurance Housing Intimacy Leisure Time Physical Health Resilience Social Support Talents/Skills Transportation  Sleep:  Number of Hours: 12      Assessment: Axis I: Alzheimer's dementia and major depressive disorder  Plan:   Plan of Care:  PLAN:  1. Affirm with the patient that the medications are taken as ordered. Patient  expressed understanding of how their medications were to be used.    Laboratory:   No labs warranted at this time.   Psychotherapy: Therapy: brief supportive therapy provided.  Discussed psychosocial stressors in detail.  Will refer to individual therapy. Continue individual therapy.  Medications:  Continue the following psychiatric medications as written prior to this appointment/ with the following changes::  a) sertraline (ZOLOFT) 100 MG tablet-Take 2 tablets (200 mg total) by mouth daily. No change in dose. b) QUEtiapine (SEROQUEL) 25 MG tablet-Take one in the morning and 2 at bedtime-No change in dose. -Risks and benefits, side effects and alternatives discussed with patient, he/she was given an opportunity to ask questions about his/her medication, illness, and treatment. All current psychiatric medications have been reviewed and discussed with the patient and adjusted as clinically appropriate. The patient has been provided an accurate and updated list of the medications being now prescribed.   Routine PRN Medications:  Negative  Consultations: The patient was encouraged to keep all PCP and specialty clinic appointments.   Safety Concerns:   Patient told to call clinic if any problems occur. Patient advised to go  to  ER  if s/he should develop SI/HI, side effects, or if symptoms worsen. Has crisis numbers to call if needed.    Other:   8. Patient was instructed to return to clinic in 2 months.  9. The patient was advised to call and cancel their mental health appointment within 24 hours of the appointment, if they are unable to keep the appointment, as well as the three no show and termination from clinic policy. 10. The patient expressed understanding of the plan and agrees with the above.     Jacqulyn Cane, MD 06/19/2013

## 2013-06-20 ENCOUNTER — Other Ambulatory Visit: Payer: Self-pay | Admitting: Family Medicine

## 2013-06-20 NOTE — Progress Notes (Signed)
  Subjective:    Patient ID: [name], male    DOB: 1935/12/12, 77 y.o.   MRN: [id number]  HPI    Review of Systems     Objective:   Physical Exam        Assessment & Plan:  Here for flu vaccine

## 2013-07-15 ENCOUNTER — Other Ambulatory Visit: Payer: Self-pay | Admitting: Family Medicine

## 2013-07-24 LAB — LIPID PANEL
HDL: 38 mg/dL — ABNORMAL LOW (ref >39)
LDL Cholesterol: 116 mg/dL — ABNORMAL HIGH (ref 0–99)
Total CHOL/HDL Ratio: 5.6 Ratio
Triglycerides: 291 mg/dL — ABNORMAL HIGH (ref <150)
VLDL: 58 mg/dL — ABNORMAL HIGH (ref 0–40)

## 2013-07-24 LAB — HEMOGLOBIN A1C: Mean Plasma Glucose: 126 mg/dL — ABNORMAL HIGH (ref <117)

## 2013-08-03 ENCOUNTER — Other Ambulatory Visit (HOSPITAL_COMMUNITY): Payer: Self-pay | Admitting: Psychiatry

## 2013-08-06 ENCOUNTER — Encounter (HOSPITAL_COMMUNITY): Payer: Self-pay | Admitting: Psychiatry

## 2013-08-06 ENCOUNTER — Ambulatory Visit (INDEPENDENT_AMBULATORY_CARE_PROVIDER_SITE_OTHER): Payer: Medicare Other | Admitting: Psychiatry

## 2013-08-06 VITALS — BP 125/59 | HR 69 | Wt 132.0 lb

## 2013-08-06 DIAGNOSIS — F329 Major depressive disorder, single episode, unspecified: Secondary | ICD-10-CM

## 2013-08-06 DIAGNOSIS — F0280 Dementia in other diseases classified elsewhere without behavioral disturbance: Secondary | ICD-10-CM

## 2013-08-06 MED ORDER — QUETIAPINE FUMARATE 25 MG PO TABS: 75.0000 mg | ORAL_TABLET | Freq: Every day | ORAL | Status: DC

## 2013-08-06 MED ORDER — SERTRALINE HCL 100 MG PO TABS: 200.0000 mg | ORAL_TABLET | Freq: Every day | ORAL | Status: DC

## 2013-08-06 MED ORDER — QUETIAPINE FUMARATE 25 MG PO TABS: ORAL_TABLET | ORAL | Status: DC

## 2013-08-06 NOTE — Progress Notes (Signed)
[facility] Follow-up Outpatient Visit  [name] Mar 15, 1936   [name] [id number] 77 y.o.  08/06/2013 1:13 PM  Chief Complaint: Follow up.  History of Present Illness: HPI Comments: Mr.  is  a 62o male with a past psychiatric history significant for Schizoaffective Disorder. The patient is referred for psychiatric services for medication management.    . Location: Patient is doing well overall. He denies any further irritability. His wife is here for collateral information.  . Quality: The patient's reports he is doing relatively well and taking his medications on a regular basis.  He has a home care person who helps Lura Em (patient's wife)  with the patient. Patsy reports the patient is doing well with some daytime drowsiness and some difficulty sleeping at night. In the area of affective symptoms, patient appears euthymic. Patient denies current suicidal ideation, intent, or plan. Patient denies current homicidal ideation, intent, or plan. Patient denies auditory hallucinations. Patient denies visual hallucinations. Patient denies symptoms of paranoia. Patient states sleep is good, with approximately 12 hours of sleep total. Appetite is good. Energy level is fair. Patient denies symptoms of anhedonia. Patient denies hopelessness, helplessness, or guilt.  . Severity: Depression: 7/10 (0=Very depressed; 5=Neutral; 10=Very Happy)  Anxiety- 5/10 (0=no anxiety; 5= moderate/tolerable anxiety; 10= panic attacks) . Duration: Since 1998  . Timing: Worse in the evenings.   . Context: Physical limitations.  . Modifying factors: Improves with watching football, Chocolate and coffee.  . Associated signs and symptoms:  As noted in review of psychiatric symptoms.   Suicidal Ideation: Negative Plan Formed: Negative Patient has means to carry out plan: Negative  Homicidal Ideation: Negative Plan Formed: Negative Patient has means to carry out plan: Negative  Review of  Systems: Psychiatric: Agitation: Reduced Hallucination: Negative Depressed Mood: Negative Insomnia: Yes Hypersomnia: Negative Altered Concentration: Negative Feels Worthless: Negative Grandiose Ideas: Negative Belief In Special Powers: Negative New/Increased Substance Abuse: Negative Compulsions: Negative  Neurologic: Headache: Negative Seizure: Yes Paresthesias: No  Past Medical Family, Social History:  Past Medical History  Diagnosis Date  . CAD (coronary artery disease)     PCI of his LAD as well as his right coronary artery  . HTN (hypertension)   . HLD (hyperlipidemia)   . Diabetes mellitus   . Hypothyroidism   . Obstructive sleep apnea   . Dementia   . Seizure disorder    Family History  Problem Relation Age of Onset  . Dementia Mother    History   Social History  . Marital Status: Married    Spouse Name: N/A    Number of Children: N/A  . Years of Education: N/A   Social History Main Topics  . Smoking status: Former Games developer  . Smokeless tobacco: Not on file     Comment: Quit in 1990.   Marland Kitchen Alcohol Use: No  . Drug Use: No  . Sexual Activity: Not on file   Other Topics Concern  . Not on file   Social History Narrative   Unable to walk by himself outside the home. Uses a walker.   Current Outpatient Prescriptions on File Prior to Visit  Medication Sig Dispense Refill  . AMBULATORY NON FORMULARY MEDICATION Medication Name: An electric bed with air mattress and rails. [facility]  1 Units  0  . diphenhydrAMINE (BENADRYL) 25 MG tablet Take 50 mg by mouth at bedtime.      Tery Sanfilippo Sodium 100 MG capsule Take 1 tablet (100 mg total)  by mouth 2 (two) times daily as needed for constipation.  60 tablet  11  . donepezil (ARICEPT) 10 MG tablet Take 10 mg by mouth at bedtime.      . ferrous sulfate 325 (65 FE) MG tablet TAKE 1 TABLET (325 MG TOTAL) BY MOUTH DAILY WITH BREAKFAST.  30 tablet  4  . lamoTRIgine (LAMICTAL) 200 MG tablet TAKE 1 TABLET (200 MG  TOTAL) BY MOUTH 2 (TWO) TIMES DAILY.  60 tablet  4  . levothyroxine (SYNTHROID, LEVOTHROID) 88 MCG tablet TAKE 1 TABLET BY MOUTH EVERY DAY  30 tablet  2  . metFORMIN (GLUCOPHAGE) 500 MG tablet TAKE 1 TABLET (500 MG TOTAL) BY MOUTH 2 (TWO) TIMES DAILY WITH A MEAL.  60 tablet  3  . QUEtiapine (SEROQUEL) 25 MG tablet Take one in the morning and 2 at bedtime  90 tablet  2  . sertraline (ZOLOFT) 100 MG tablet Take 2 tablets (200 mg total) by mouth daily.  60 tablet  1   No current facility-administered medications on file prior to visit.    Past Psychiatric History/Hospitalization(s): Anxiety: Negative Bipolar Disorder: Negative Depression: Negative Mania: Negative Psychosis: Negative Schizophrenia: Negative Personality Disorder: Negative Hospitalization for psychiatric illness: Negative History of Electroconvulsive Shock Therapy: Negative Prior Suicide Attempts: Negative  Physical Exam:  Constitutional:  BP 125/59  Pulse 69  Wt 132 lb (59.875 kg)  General Appearance: alert, oriented, no acute distress  Musculoskeletal: Strength & Muscle Tone: within normal limits-with the exception of weakness in his lower limbs bilaterally Gait & Station: In a wheelchair and unable to stand without support. Patient leans: N/A  General Appearance: Casual and Fairly Groomed  Patent attorney::  Poor  Speech:  Clear and Coherent and Normal Rate  Volume:  Normal  Mood: "good" Depression: 7/10 (0=Very depressed; 5=Neutral; 10=Very Happy)  Anxiety- 5/10 (0=no anxiety; 5= moderate/tolerable anxiety; 10= panic attacks)   Affect:  Appropriate and Congruent  Thought Process:  Circumstantial, Coherent and Linear  Orientation:  Full (Time, Place, and Person)  Thought Content:  WDL  Suicidal Thoughts:  No  Homicidal Thoughts:  No  Memory:  Immediate;   Good Recent;   Fair Remote;   Poor  Judgement:  Fair  Insight:  Good  Psychomotor Activity:  Normal  Concentration:  Fair  Akathisia:  Negative   Handed:  Right  AIMS (if indicated):  As noted in chart   Assets:  Communication Skills Desire for Improvement Financial Resources/Insurance Housing Intimacy Leisure Time Physical Health Resilience Social Support Talents/Skills Transportation  Sleep:  Number of Hours: 12     Assessment: Axis I: Alzheimer's dementia and major depressive disorder  Plan:   Plan of Care:  PLAN:  1. Affirm with the patient that the medications are taken as ordered. Patient  expressed understanding of how their medications were to be used.    Laboratory:   No labs warranted at this time.   Psychotherapy: Therapy: brief supportive therapy provided.  Discussed psychosocial stressors in detail.  Will refer to individual therapy. Continue individual therapy.  Medications:  Continue the following psychiatric medications as written prior to this appointment/ with the following changes::  a) sertraline (ZOLOFT) 100 MG tablet-Take 2 tablets (200 mg total) by mouth daily. No change in dose. b) QUEtiapine (SEROQUEL) 25 MG tablet-Take 3 tablets at bedtime-No change in dose. -Risks and benefits, side effects and alternatives discussed with patient, he was given an opportunity to ask questions about his/her medication, illness, and treatment. All current psychiatric  medications have been reviewed and discussed with the patient and adjusted as clinically appropriate. The patient has been provided an accurate and updated list of the medications being now prescribed.   Routine PRN Medications:  Negative  Consultations: The patient was encouraged to keep all PCP and specialty clinic appointments.   Safety Concerns:   Patient told to call clinic if any problems occur. Patient advised to go to  ER  if he should develop SI/HI, side effects, or if symptoms worsen. Has crisis numbers to call if needed.    Other:   8. Patient was instructed to return to clinic in 2 months.  9. The patient was advised to call and cancel  their mental health appointment within 24 hours of the appointment, if they are unable to keep the appointment, as well as the three no show and termination from clinic policy. 10. The patient expressed understanding of the plan and agrees with the above.  Time spent: 25 minutes  Jacqulyn Cane, MD 08/06/2013

## 2013-08-19 ENCOUNTER — Ambulatory Visit (HOSPITAL_COMMUNITY): Payer: Self-pay | Admitting: Psychiatry

## 2013-08-22 ENCOUNTER — Telehealth: Payer: Self-pay | Admitting: *Deleted

## 2013-08-22 NOTE — Telephone Encounter (Signed)
Pt's forms faxed and scanned.Tanner PacasBarkley, Tanner Nunziata EscalanteLynetta

## 2013-09-10 ENCOUNTER — Other Ambulatory Visit: Payer: Self-pay | Admitting: Family Medicine

## 2013-09-29 ENCOUNTER — Other Ambulatory Visit: Payer: Self-pay | Admitting: Family Medicine

## 2013-09-30 ENCOUNTER — Other Ambulatory Visit: Payer: Self-pay | Admitting: *Deleted

## 2013-09-30 MED ORDER — LEVOTHYROXINE SODIUM 88 MCG PO TABS: ORAL_TABLET | ORAL | Status: DC

## 2013-09-30 MED ORDER — LAMOTRIGINE 200 MG PO TABS: ORAL_TABLET | ORAL | Status: DC

## 2013-09-30 MED ORDER — METFORMIN HCL 500 MG PO TABS: ORAL_TABLET | ORAL | Status: DC

## 2013-10-22 ENCOUNTER — Telehealth (HOSPITAL_COMMUNITY): Payer: Self-pay

## 2013-10-23 MED ORDER — SERTRALINE HCL 100 MG PO TABS: 200.0000 mg | ORAL_TABLET | Freq: Every day | ORAL | Status: DC

## 2013-10-23 MED ORDER — QUETIAPINE FUMARATE 25 MG PO TABS: 75.0000 mg | ORAL_TABLET | Freq: Every day | ORAL | Status: DC

## 2013-10-23 NOTE — Telephone Encounter (Signed)
Called patient and patient's wife, informed her that April 15th, 2015 would be my last day at this clinic. Will provide 3 months worth of prescriptions for the patient.

## 2013-10-28 ENCOUNTER — Ambulatory Visit (HOSPITAL_COMMUNITY): Payer: Self-pay | Admitting: Psychiatry

## 2013-11-26 ENCOUNTER — Encounter: Payer: Self-pay | Admitting: Family Medicine

## 2013-11-26 ENCOUNTER — Ambulatory Visit (INDEPENDENT_AMBULATORY_CARE_PROVIDER_SITE_OTHER): Payer: Medicare Other | Admitting: Family Medicine

## 2013-11-26 VITALS — BP 133/62 | HR 93 | Wt 138.0 lb

## 2013-11-26 DIAGNOSIS — E119 Type 2 diabetes mellitus without complications: Secondary | ICD-10-CM

## 2013-11-26 DIAGNOSIS — F0280 Dementia in other diseases classified elsewhere without behavioral disturbance: Secondary | ICD-10-CM

## 2013-11-26 DIAGNOSIS — F3289 Other specified depressive episodes: Secondary | ICD-10-CM

## 2013-11-26 DIAGNOSIS — F329 Major depressive disorder, single episode, unspecified: Secondary | ICD-10-CM

## 2013-11-26 DIAGNOSIS — E039 Hypothyroidism, unspecified: Secondary | ICD-10-CM

## 2013-11-26 DIAGNOSIS — M4800 Spinal stenosis, site unspecified: Secondary | ICD-10-CM

## 2013-11-26 DIAGNOSIS — G309 Alzheimer's disease, unspecified: Secondary | ICD-10-CM

## 2013-11-26 LAB — POCT GLYCOSYLATED HEMOGLOBIN (HGB A1C): HEMOGLOBIN A1C: 5.5

## 2013-11-26 MED ORDER — DONEPEZIL HCL 10 MG PO TABS: 10.0000 mg | ORAL_TABLET | Freq: Every day | ORAL | Status: DC

## 2013-11-26 MED ORDER — HYDROCODONE-ACETAMINOPHEN 5-325 MG PO TABS: 1.0000 | ORAL_TABLET | Freq: Three times a day (TID) | ORAL | Status: DC | PRN

## 2013-11-26 MED ORDER — ALPRAZOLAM 0.25 MG PO TABS: 0.2500 mg | ORAL_TABLET | Freq: Two times a day (BID) | ORAL | Status: DC | PRN

## 2013-11-26 NOTE — Patient Instructions (Signed)
Stop metformin 

## 2013-11-26 NOTE — Progress Notes (Signed)
   Subjective:    Patient ID: [name], male    DOB: [birth year], 78 y.o.   MRN: [id number]  HPI Followup joint pain/spinal stenosis-Has been staying in bed more.  He is total assist.  Not walking. Can stand with assitance for a few seconds. He's been having a lot of pain and discomfort in his bottom. He's lost weight and feels like the bones in his bottom or more tender and painful. He has not had any skin breakdown or ulcerations. He says he just gets uncomfortable and his wife notices that he becomes more fidgety and agitated. They are to help her prescription for tramadol home which they use occasionally but she feels like it's not strong enough and is requesting something a little stronger.  Diabetes-he continues to take the metformin twice a day. His wife were caretaker will occasionally hold his dose if his blood sugar is under 120. He has not had any hypoglycemic events. No wounds or sores or cuts that are not healing well. Occasionally checking blood sugars at home..    Alzheimer's disease-his wife does feel that he is progressed. He is able to communicate his immediate needs. He is currently on Aricept 10 mg daily. He does use her call at bedtime to help with sleep.  Hypothyroidism-no recent changes in weight. His wife notices he doesn't eat as much as he use to do this is been going on for the last year.  Review of Systems     Objective:   Physical Exam  Constitutional: He is oriented to person, place, and time. He appears well-developed and well-nourished.  HENT:  Head: Normocephalic and atraumatic.  Neck: Neck supple. No thyromegaly present.  Cardiovascular: Normal rate, regular rhythm and normal heart sounds.   No carotid bruits  Pulmonary/Chest: Effort normal and breath sounds normal.  Musculoskeletal: He exhibits no edema.  Neurological: He is alert and oriented to person, place, and time.  Skin: Skin is warm and dry.  Psychiatric: He has a normal mood and affect. His  behavior is normal.          Assessment & Plan:  DM- well controlled. In fact his A1c is down to 5.5 today. Going to discontinue his metformin. I feel it's more risky for him to be on it need to continue for now. I will see him back in 6 months and we'll recheck an A1c at that time to make sure that he's not going over 7.  Joint pain/spinal stenosis-will increase pain regimen to hydrocodone. Recommend start with half a tab as it can cause sedation. Use only as needed. If he has more mild pain can still use the tramadol.  Agitation-will give her a small prescription for alprazolam to use as needed for agitation. Warned that this does cause sedation and sleepiness. Use sparingly.  Alzheimer's disease-continue Aricept and Seroquel.  Hypothyroidism-doing well on current regimen. Due to recheck TSH.

## 2013-12-03 ENCOUNTER — Telehealth: Payer: Self-pay | Admitting: *Deleted

## 2013-12-03 ENCOUNTER — Other Ambulatory Visit: Payer: Self-pay | Admitting: Family Medicine

## 2013-12-03 ENCOUNTER — Other Ambulatory Visit: Payer: Self-pay | Admitting: *Deleted

## 2013-12-03 DIAGNOSIS — E039 Hypothyroidism, unspecified: Secondary | ICD-10-CM

## 2013-12-03 LAB — COMPLETE METABOLIC PANEL WITH GFR
ALT: 9 U/L (ref 0–53)
AST: 9 U/L (ref 0–37)
Albumin: 3.9 g/dL (ref 3.5–5.2)
Alkaline Phosphatase: 90 U/L (ref 39–117)
BILIRUBIN TOTAL: 0.4 mg/dL (ref 0.2–1.2)
BUN: 10 mg/dL (ref 6–23)
CO2: 24 meq/L (ref 19–32)
CREATININE: 0.95 mg/dL (ref 0.50–1.35)
Calcium: 9.4 mg/dL (ref 8.4–10.5)
Chloride: 99 mEq/L (ref 96–112)
GFR, Est Non African American: 78 mL/min
GLUCOSE: 113 mg/dL — AB (ref 70–99)
Potassium: 4.5 mEq/L (ref 3.5–5.3)
Sodium: 138 mEq/L (ref 135–145)
Total Protein: 7 g/dL (ref 6.0–8.3)

## 2013-12-03 LAB — TSH: TSH: 4.971 u[IU]/mL — ABNORMAL HIGH (ref 0.350–4.500)

## 2013-12-03 MED ORDER — LEVOTHYROXINE SODIUM 100 MCG PO TABS: ORAL_TABLET | ORAL | Status: DC

## 2013-12-03 NOTE — Telephone Encounter (Signed)
Ready for p/u .[name]

## 2013-12-03 NOTE — Telephone Encounter (Signed)
Labs have came in and didn't know if you wanted to change dose?Barry Dienes. Sharda Keddy, LPN

## 2013-12-04 ENCOUNTER — Other Ambulatory Visit: Payer: Self-pay | Admitting: Family Medicine

## 2013-12-04 LAB — TSH

## 2013-12-14 ENCOUNTER — Other Ambulatory Visit (HOSPITAL_COMMUNITY): Payer: Self-pay | Admitting: Psychiatry

## 2013-12-18 ENCOUNTER — Other Ambulatory Visit: Payer: Self-pay | Admitting: Family Medicine

## 2013-12-27 ENCOUNTER — Other Ambulatory Visit: Payer: Self-pay | Admitting: *Deleted

## 2013-12-27 DIAGNOSIS — G309 Alzheimer's disease, unspecified: Principal | ICD-10-CM

## 2013-12-27 DIAGNOSIS — F0280 Dementia in other diseases classified elsewhere without behavioral disturbance: Secondary | ICD-10-CM

## 2013-12-27 MED ORDER — ALPRAZOLAM 0.25 MG PO TABS: 0.2500 mg | ORAL_TABLET | Freq: Two times a day (BID) | ORAL | Status: DC | PRN

## 2013-12-27 MED ORDER — HYDROCODONE-ACETAMINOPHEN 5-325 MG PO TABS: 1.0000 | ORAL_TABLET | Freq: Three times a day (TID) | ORAL | Status: AC | PRN

## 2014-01-10 ENCOUNTER — Ambulatory Visit (INDEPENDENT_AMBULATORY_CARE_PROVIDER_SITE_OTHER): Payer: Medicare Other | Admitting: Family Medicine

## 2014-01-10 ENCOUNTER — Encounter: Payer: Self-pay | Admitting: Family Medicine

## 2014-01-10 VITALS — BP 124/65 | HR 60 | Wt 138.0 lb

## 2014-01-10 DIAGNOSIS — IMO0002 Reserved for concepts with insufficient information to code with codable children: Secondary | ICD-10-CM

## 2014-01-10 DIAGNOSIS — Z4802 Encounter for removal of sutures: Secondary | ICD-10-CM

## 2014-01-10 DIAGNOSIS — T148XXA Other injury of unspecified body region, initial encounter: Secondary | ICD-10-CM

## 2014-01-10 NOTE — Progress Notes (Signed)
CC: [name] is a 78 y.o. male is here for ED f/u Remove sutures   Subjective: HPI:  Followup laceration of the forehead. Last week he fell during a transfer and hit his right forehead on a piece of furniture. He required 7 sutures at a local emergency room. Since then the family denies any discharge from the site of the wound. He been keeping it clean and dry.  He denies any fevers, chills, nausea, rapid heartbeat, headache, confusion beyond his baseline dementia.  He also suffered a skin tear to the right forearm that did not require sutures but did receive a Band-Aid to better approximate the wound.  The skin tear did have some bleeding the days after they left the emergency room however has not had any discharge or bleeding since. Family denies any lacerations or bleeding abnormalities.  Review Of Systems Outlined In HPI  Past Medical History  Diagnosis Date  . CAD (coronary artery disease)     PCI of his LAD as well as his right coronary artery  . HTN (hypertension)   . HLD (hyperlipidemia)   . Diabetes mellitus   . Hypothyroidism   . Obstructive sleep apnea   . Dementia   . Seizure disorder     Past Surgical History  Procedure Laterality Date  . Cholecystectomy    . Pci      right coronary and left anterior descending  . Posterior laminectomy / decompression cervical spine     Family History  Problem Relation Age of Onset  . Dementia Mother     History   Social History  . Marital Status: Married    Spouse Name: N/A    Number of Children: N/A  . Years of Education: N/A   Occupational History  . Not on file.   Social History Main Topics  . Smoking status: Former Games developermoker  . Smokeless tobacco: Not on file     Comment: Quit in 1990.   Marland Kitchen. Alcohol Use: No  . Drug Use: No     Comment: Caffiene-about 24 ounces  . Sexual Activity: Not on file   Other Topics Concern  . Not on file   Social History Narrative   Unable to walk by himself outside the home. Uses a  walker.     Objective: BP 124/65  Pulse 60  Wt 138 lb (62.596 kg)  SpO2 96%  General: Alert and Oriented, No Acute Distress HEENT: Pupils equal, round, reactive to light. Conjunctivae clear.  Moist mucous membranes Lungs: Clear to auscultation bilaterally, no wheezing/ronchi/rales.  Comfortable work of breathing. Good air movement. Cardiac: Regular rate and rhythm. Normal S1/S2.  No murmurs, rubs, nor gallops.   Extremities: No peripheral edema.  Strong peripheral pulses.  Mental Status: No depression, anxiety, nor agitation. Skin: Warm and dry. Above the right eyebrow there is a clean dry and intact well approximated laceration which appears well-healed with 7 simple interrupted sutures.  On the right forearm he has a 3 cm length skin tear with scant bleeding from the distal edge with compression but no surrounding fluctuance or erythema nor warmth.  Assessment & Plan: [name] was seen today for ed f/u remove sutures.  Diagnoses and associated orders for this visit:  Skin tear  Visit for suture removal    Skin tear: Bleeding is easily stopped with avoidance of manipulation. Discussed with family if this continues to use Steri-Strips which I provided them with today. Overall the skin tear appears well healing but not fully  healed. The suture site on his forehead appears well healed, I was able to remove 6 of the sutures completely the final suture I was able to remove the knot and elected to not further explore his healing wound to get the loose remaining blue suture since this will easily come out when the scab has fully field and fallen off.  This was discussed with the family.  25 minutes spent face-to-face during visit today of which at least 50% was counseling or coordinating care regarding: 1. Skin tear   2. Visit for suture removal      Return if symptoms worsen or fail to improve.

## 2014-01-15 ENCOUNTER — Other Ambulatory Visit: Payer: Self-pay | Admitting: Family Medicine

## 2014-01-21 ENCOUNTER — Other Ambulatory Visit: Payer: Self-pay | Admitting: Family Medicine

## 2014-01-23 ENCOUNTER — Other Ambulatory Visit: Payer: Self-pay | Admitting: Family Medicine

## 2014-01-27 ENCOUNTER — Telehealth: Payer: Self-pay | Admitting: *Deleted

## 2014-01-27 DIAGNOSIS — F0280 Dementia in other diseases classified elsewhere without behavioral disturbance: Secondary | ICD-10-CM

## 2014-01-27 DIAGNOSIS — G309 Alzheimer's disease, unspecified: Principal | ICD-10-CM

## 2014-01-27 MED ORDER — LORAZEPAM 0.5 MG PO TABS: 0.5000 mg | ORAL_TABLET | Freq: Every evening | ORAL | Status: AC | PRN

## 2014-01-27 NOTE — Telephone Encounter (Signed)
Pt's wife called in and stated that the vicodin is not strong enough and would like to know if she can get vicodin 10-325? Please advise.Loralee PacasBarkley, Tonya [place]

## 2014-01-28 MED ORDER — HYDROCODONE-ACETAMINOPHEN 10-325 MG PO TABS: 1.0000 | ORAL_TABLET | Freq: Three times a day (TID) | ORAL | Status: DC | PRN

## 2014-01-28 NOTE — Telephone Encounter (Signed)
Ok will print new rx for higher dose.

## 2014-01-30 ENCOUNTER — Encounter: Payer: Self-pay | Admitting: Family Medicine

## 2014-01-30 DIAGNOSIS — G4700 Insomnia, unspecified: Secondary | ICD-10-CM | POA: Insufficient documentation

## 2014-01-30 NOTE — Telephone Encounter (Signed)
Pt informed.[name], [name]  

## 2014-02-07 ENCOUNTER — Other Ambulatory Visit: Payer: Self-pay | Admitting: Family Medicine

## 2014-02-12 ENCOUNTER — Ambulatory Visit (INDEPENDENT_AMBULATORY_CARE_PROVIDER_SITE_OTHER): Payer: Medicare Other | Admitting: Psychiatry

## 2014-02-12 DIAGNOSIS — F331 Major depressive disorder, recurrent, moderate: Secondary | ICD-10-CM

## 2014-02-12 DIAGNOSIS — G309 Alzheimer's disease, unspecified: Secondary | ICD-10-CM

## 2014-02-12 DIAGNOSIS — F0280 Dementia in other diseases classified elsewhere without behavioral disturbance: Secondary | ICD-10-CM

## 2014-02-12 MED ORDER — ALPRAZOLAM 0.25 MG PO TABS: 0.2500 mg | ORAL_TABLET | Freq: Every day | ORAL | Status: DC | PRN

## 2014-02-12 MED ORDER — QUETIAPINE FUMARATE 25 MG PO TABS: 75.0000 mg | ORAL_TABLET | Freq: Every day | ORAL | Status: DC

## 2014-02-12 MED ORDER — SERTRALINE HCL 100 MG PO TABS: 200.0000 mg | ORAL_TABLET | Freq: Every day | ORAL | Status: DC

## 2014-02-12 NOTE — Progress Notes (Signed)
Patient ID: [name] Dusenbury, male   DOB: Dec 21, 1935, 78 y.o.   MRN: [id number]   [facility]  [name] Boettger Dec 21, 1935   [name] Radich [id number] 78 y.o.  02/12/2014 11:50 AM  Chief Complaint: Follow up.  History of Present Illness: HPI Comments: Mr.  is  a [age]o male with a past psychiatric history significant for Schizoaffective Disorder. The patient is referred for psychiatric services for medication management.  Has history of Spinal stenosis and Dementia. Has care giver who spends his day with him. Otherwise wife lives with him. Married for 52 years.  . Location: Patient is doing well overall. He denies any further irritability. His wife is here for collateral information. Continues to ambulate with wheel chair. Had to go ER for tailbone examination.   . Quality: The patient's reports he is doing relatively well and taking his medications on a regular basis.  He has a home care person who helps Lura Ematsy (patient's wife)  with the patient. Patsy reports the patient is doing well with no change in mood. At times he has to have xanax given by his PCP. Uses PRN and wanting refill today.  In the area of affective symptoms, patient appears euthymic. Patient denies current suicidal ideation, intent, or plan. Patient denies current homicidal ideation, intent, or plan. Patient denies auditory hallucinations. Patient denies visual hallucinations. Patient denies symptoms of paranoia. Patient states sleep is good, with approximately 12 hours of sleep total. Appetite is good. Energy level is fair. Patient denies symptoms of anhedonia. Patient denies hopelessness, helplessness, or guilt.  . Severity: Depression: 8/10 (0=Very depressed; 5=Neutral; 10=Very Happy)  Anxiety- 5/10 (0=no anxiety; 5= moderate/tolerable anxiety; 10= panic attacks) . Duration: Since 1998  . Timing: Worse in the evenings.   . Context: Physical limitations.  . Modifying factors: Improves  with watching football, Chocolate and coffee.  . Associated signs and symptoms:  As noted in review of psychiatric symptoms.   Suicidal Ideation: Negative Plan Formed: Negative Patient has means to carry out plan: Negative  Homicidal Ideation: Negative Plan Formed: Negative Patient has means to carry out plan: Negative  Review of Systems: Psychiatric: Agitation: Reduced Hallucination: Negative Depressed Mood: Negative Insomnia: Yes Hypersomnia: Negative Altered Concentration: Negative Feels Worthless: Negative Grandiose Ideas: Negative Belief In Special Powers: Negative New/Increased Substance Abuse: Negative Compulsions: Negative  Neurologic: Headache: Negative Seizure: Yes Paresthesias: No  Past Medical Family, Social History:  Past Medical History  Diagnosis Date  . CAD (coronary artery disease)     PCI of his LAD as well as his right coronary artery  . HTN (hypertension)   . HLD (hyperlipidemia)   . Diabetes mellitus   . Hypothyroidism   . Obstructive sleep apnea   . Dementia   . Seizure disorder    Family History  Problem Relation Age of Onset  . Dementia Mother    History   Social History  . Marital Status: Married    Spouse Name: N/A    Number of Children: N/A  . Years of Education: N/A   Social History Main Topics  . Smoking status: Former Games developermoker  . Smokeless tobacco: Not on file     Comment: Quit in 1990.   Marland Kitchen. Alcohol Use: No  . Drug Use: No     Comment: Caffiene-about 24 ounces  . Sexual Activity: Not on file   Other Topics Concern  . Not on file   Social History Narrative   Unable to walk by himself  outside the home. Uses a walker.   Current Outpatient Prescriptions on File Prior to Visit  Medication Sig Dispense Refill  . AMBULATORY NON FORMULARY MEDICATION Medication Name: An electric bed with air mattress and rails. [facility]  1 Units  0  . [facility] IRON 325 (65 FE) MG tablet TAKE 1 TABLET (325 MG TOTAL) BY MOUTH DAILY WITH  BREAKFAST.  30 tablet  4  . diphenhydrAMINE (BENADRYL) 25 MG tablet Take 50 mg by mouth at bedtime.      Tery Sanfilippo Sodium 100 MG capsule Take 1 tablet (100 mg total) by mouth 2 (two) times daily as needed for constipation.  60 tablet  11  . donepezil (ARICEPT) 10 MG tablet Take 1 tablet (10 mg total) by mouth at bedtime.  90 tablet  3  . HYDROcodone-acetaminophen (NORCO) 10-325 MG per tablet Take 1 tablet by mouth every 8 (eight) hours as needed for severe pain.  45 tablet  0  . lamoTRIgine (LAMICTAL) 200 MG tablet TAKE 1 TABLET (200 MG TOTAL) BY MOUTH 2 (TWO) TIMES DAILY.  120 tablet  1  . levothyroxine (SYNTHROID, LEVOTHROID) 100 MCG tablet TAKE 1 TABLET BY MOUTH EVERY DAY  90 tablet  0   No current facility-administered medications on file prior to visit.      Physical Exam:  Constitutional:  There were no vitals taken for this visit. His weight is 139 lbs.   General Appearance: alert, oriented, no acute distress  Musculoskeletal: Strength & Muscle Tone: within normal limits-with the exception of weakness in his lower limbs bilaterally Gait & Station: In a wheelchair and unable to stand without support. Patient leans: N/A  General Appearance: Casual and Fairly Groomed  Patent attorney::  Poor  Speech:  Clear and Coherent and Normal Rate  Volume:  Normal  Mood: "good" Depression: 8/10 (0=Very depressed; 5=Neutral; 10=Very Happy)  Anxiety- 5/10 (0=no anxiety; 5= moderate/tolerable anxiety; 10= panic attacks)   Affect:  Appropriate and Congruent  Thought Process:  Circumstantial, Coherent and Linear  Orientation:  Full (Time, Place, and Person)  Thought Content:  WDL  Suicidal Thoughts:  No  Homicidal Thoughts:  No  Memory:  Immediate;   Good Recent;   Fair Remote;   Poor  Judgement:  Fair  Insight:  Good  Psychomotor Activity:  Normal  Concentration:  Fair  Akathisia:  Negative  Handed:  Right  AIMS (if indicated):  As noted in chart   Assets:  Communication  Skills Desire for Improvement Financial Resources/Insurance Housing Intimacy Leisure Time Physical Health Resilience Social Support Talents/Skills Transportation  Sleep:  Number of Hours: 12     Assessment: Axis I: Alzheimer's dementia and major depressive disorder  Plan:   Plan of Care:  PLAN:  1. Affirm with the patient that the medications are taken as ordered. Patient  expressed understanding of how their medications were to be used.    Laboratory:   No labs warranted at this time.   Psychotherapy: Therapy: brief supportive therapy provided.  Discussed psychosocial stressors in detail.  Will refer to individual therapy. Continue individual therapy.  Medications:  Continue the following psychiatric medications as written prior to this appointment/ with the following changes::  a) sertraline (ZOLOFT) 100 MG tablet-Take 2 tablets (200 mg total) by mouth daily. No change in dose. b) QUEtiapine (SEROQUEL) 25 MG tablet-Take 3 tablets at bedtime-No change in dose. -Risks and benefits, side effects and alternatives discussed with patient, he was given an opportunity to ask questions about  his/her medication, illness, and treatment. All current psychiatric medications have been reviewed and discussed with the patient and adjusted as clinically appropriate. The patient has been provided an accurate and updated list of the medications being now prescribed.  Xanax prn only 20 tablets prescribed for prn agitation. Initially prescribed by PCP  Routine PRN Medications:  Negative  Consultations: The patient was encouraged to keep all PCP and specialty clinic appointments.   Safety Concerns:   Patient told to call clinic if any problems occur. Patient advised to go to  ER  if he should develop SI/HI, side effects, or if symptoms worsen. Has crisis numbers to call if needed.    Other:   8. Patient was instructed to return to clinic in 3 months.  9. The patient was advised to call and cancel  their mental health appointment within 24 hours of the appointment, if they are unable to keep the appointment, as well as the three no show and termination from clinic policy. 10. The patient expressed understanding of the plan and agrees with the above.  Time spent: 25 minutes  Thresa RossAKHTAR, Blandina Renaldo, MD 02/12/2014

## 2014-02-18 ENCOUNTER — Other Ambulatory Visit: Payer: Self-pay | Admitting: *Deleted

## 2014-02-18 MED ORDER — HYDROCODONE-ACETAMINOPHEN 10-325 MG PO TABS: 1.0000 | ORAL_TABLET | Freq: Three times a day (TID) | ORAL | Status: DC | PRN

## 2014-02-18 NOTE — Telephone Encounter (Signed)
Pt's wife informed of rx.Tanner PacasBarkley, Jenicka Coxe [place]

## 2014-02-27 ENCOUNTER — Telehealth: Payer: Self-pay | Admitting: *Deleted

## 2014-02-27 ENCOUNTER — Other Ambulatory Visit: Payer: Self-pay | Admitting: Family Medicine

## 2014-02-28 ENCOUNTER — Telehealth: Payer: Self-pay | Admitting: *Deleted

## 2014-02-28 MED ORDER — HALOPERIDOL 0.5 MG PO TABS: 0.5000 mg | ORAL_TABLET | Freq: Two times a day (BID) | ORAL | Status: DC | PRN

## 2014-02-28 NOTE — Telephone Encounter (Signed)
I really don't want to go above twice today because he cannot use or cause a rebound phenomenon where the medication wears off and that actually stimulates agitation. I will send her for prescription for Haldol to use instead.

## 2014-02-28 NOTE — Telephone Encounter (Signed)
Pt's wife called and stated that pt has become more agitated and wanted to know if Dr. Linford ArnoldMetheney would allow him to have more xanax to help with this.Loralee PacasBarkley, Jetson Pickrel [place]

## 2014-02-28 NOTE — Telephone Encounter (Signed)
Pt's wife informed.Naleyah Ohlinger [name]  

## 2014-03-08 ENCOUNTER — Other Ambulatory Visit: Payer: Self-pay | Admitting: Family Medicine

## 2014-03-31 ENCOUNTER — Other Ambulatory Visit: Payer: Self-pay | Admitting: Family Medicine

## 2014-04-03 ENCOUNTER — Other Ambulatory Visit: Payer: Self-pay | Admitting: Family Medicine

## 2014-04-17 ENCOUNTER — Telehealth (HOSPITAL_COMMUNITY): Payer: Self-pay

## 2014-04-17 ENCOUNTER — Other Ambulatory Visit (HOSPITAL_COMMUNITY): Payer: Self-pay | Admitting: *Deleted

## 2014-04-17 NOTE — Telephone Encounter (Signed)
Spoke with Marchelle Folks ([facility]) for pt refill of Seroquel 25 mg and Zoloft 100 mg. Pt was last seen in the office on 02/12/14 and next appt is 05/15/14. Informed pt's wife of appt and stated she understood. s

## 2014-04-17 NOTE — Telephone Encounter (Signed)
Spoke with pt's wife about refills for Seroquel 25 mg and Zoloft 100 mg. Informed pt's wife I would speak with Dr. Alta Corning and call her back. Pt's wife showed she compend and understood.

## 2014-04-17 NOTE — Telephone Encounter (Signed)
Please refill scripts for pt . Seroquel and Zoloft.

## 2014-04-28 ENCOUNTER — Other Ambulatory Visit: Payer: Self-pay | Admitting: Family Medicine

## 2014-04-29 ENCOUNTER — Other Ambulatory Visit: Payer: Self-pay | Admitting: Family Medicine

## 2014-05-13 ENCOUNTER — Telehealth: Payer: Self-pay | Admitting: *Deleted

## 2014-05-13 NOTE — Telephone Encounter (Signed)
Patsy Ludlam called and she would like you to manage Toran's zoloft, aricept and seroquel so that they do not have to be seen by Dr. Alta CorningAhktar. She said that they rec'd a very large bill from [employer] due to Dr. Alta CorningAhktar not being in network. Please advise. Corliss SkainsJamie Boone Gear, CMA

## 2014-05-14 ENCOUNTER — Other Ambulatory Visit: Payer: Self-pay | Admitting: *Deleted

## 2014-05-14 MED ORDER — SERTRALINE HCL 100 MG PO TABS: 200.0000 mg | ORAL_TABLET | Freq: Every day | ORAL | Status: DC

## 2014-05-14 MED ORDER — DONEPEZIL HCL 10 MG PO TABS: 10.0000 mg | ORAL_TABLET | Freq: Every day | ORAL | Status: DC

## 2014-05-14 MED ORDER — QUETIAPINE FUMARATE 25 MG PO TABS: 75.0000 mg | ORAL_TABLET | Freq: Every day | ORAL | Status: DC

## 2014-05-14 NOTE — Telephone Encounter (Signed)
That is fine. Okay to send refills if needed.

## 2014-05-14 NOTE — Telephone Encounter (Signed)
Voicemail left that medications were refilled. Corliss SkainsJamie Bence Trapp, CMA

## 2014-05-15 ENCOUNTER — Ambulatory Visit (HOSPITAL_COMMUNITY): Payer: Self-pay | Admitting: Psychiatry

## 2014-05-29 ENCOUNTER — Other Ambulatory Visit: Payer: Self-pay | Admitting: Family Medicine

## 2014-05-29 ENCOUNTER — Ambulatory Visit (INDEPENDENT_AMBULATORY_CARE_PROVIDER_SITE_OTHER): Payer: Medicare Other | Admitting: Family Medicine

## 2014-05-29 ENCOUNTER — Encounter: Payer: Self-pay | Admitting: Family Medicine

## 2014-05-29 VITALS — BP 127/74 | HR 80 | Temp 97.6°F | Wt 138.0 lb

## 2014-05-29 DIAGNOSIS — R569 Unspecified convulsions: Secondary | ICD-10-CM

## 2014-05-29 DIAGNOSIS — G309 Alzheimer's disease, unspecified: Secondary | ICD-10-CM

## 2014-05-29 DIAGNOSIS — N138 Other obstructive and reflux uropathy: Secondary | ICD-10-CM

## 2014-05-29 DIAGNOSIS — Z23 Encounter for immunization: Secondary | ICD-10-CM

## 2014-05-29 DIAGNOSIS — F0280 Dementia in other diseases classified elsewhere without behavioral disturbance: Secondary | ICD-10-CM

## 2014-05-29 DIAGNOSIS — E119 Type 2 diabetes mellitus without complications: Secondary | ICD-10-CM

## 2014-05-29 DIAGNOSIS — Z0000 Encounter for general adult medical examination without abnormal findings: Secondary | ICD-10-CM

## 2014-05-29 DIAGNOSIS — E039 Hypothyroidism, unspecified: Secondary | ICD-10-CM

## 2014-05-29 DIAGNOSIS — N401 Enlarged prostate with lower urinary tract symptoms: Secondary | ICD-10-CM

## 2014-05-29 DIAGNOSIS — R269 Unspecified abnormalities of gait and mobility: Secondary | ICD-10-CM

## 2014-05-29 LAB — POCT GLYCOSYLATED HEMOGLOBIN (HGB A1C): HEMOGLOBIN A1C: 6.1

## 2014-05-29 NOTE — Assessment & Plan Note (Signed)
Due to recheck levels. TSH was mildly elevated back in the spring. No recent skin or hair, weight changes.

## 2014-05-29 NOTE — Assessment & Plan Note (Signed)
Continue Lamictal.  No changes.

## 2014-05-29 NOTE — Assessment & Plan Note (Signed)
He is currently on Lamictal and Aricept for his Alzheimer's dementia and for mood stability. He does use Haldol occasionally in the evenings as he does get some sundowning around  5 to 6 PM.

## 2014-05-29 NOTE — Progress Notes (Signed)
Subjective:    [name] is a 78 y.o. male who presents for Medicare Annual/Subsequent preventive examination.   Preventive Screening-Counseling & Management  Tobacco History  Smoking status  . Former Smoker  Smokeless tobacco  . Not on file    Comment: Quit in 1990.     Problems Prior to Visit 1. forms were completed by his wife. He is actually thinking about placing him in a nursing home so brought forms to be completed. 2.  he does have chronic pain in the tailbone area. He also has some spinal stenosis. He did have an injection Think this would help with his pain and unfortunately it did not. He is no longer taking the hydrocodone. It was not really helping his pain and it was causing constipation. He is now off of it and his bowels are moving regularly. He does have a chronic Foley catheter in place. 3. his wife says he does typically get agitated around 5 to 6 in the evening and gets a little bit more disoriented. She will occasionally give him a dose of Haldol when this happens. Typically one tab works well. 4. Hypothyroidism-no recent changes in weight, skin, or hair. Lab Results  Component Value Date   TSH 4.726* 05/29/2014   5. she reports that he does have a good appetite. No recent changes. They have not been checking blood sugars. He has not experienced any hypoglycemic events. 5. generalized muscle weakness and gait disorder-he has not ambulatory. He can only bear weight with assistance and transferring from the bed to a chair. His wife has noticed that he's had a little bit more of a tremor with activity. Does not seem to be at rest. Only when he straining himself for trying to transition. He does have a home aide for approximately 4 hours per day.   Current Problems (verified) Patient Active Problem List   Diagnosis Date Noted  . Insomnia 01/30/2014  . Dehiscence of closure of skin 07/19/2012  . Disease of spinal cord 06/01/2012  . Lumbar canal stenosis  06/01/2012  . Localization-related (focal) (partial) epilepsy and epileptic syndromes with complex partial seizures, without mention of intractable epilepsy 06/01/2012  . Discoordination 06/01/2012  . Epilepsy seizure, generalized, convulsive 06/01/2012  . Abnormal gait 06/01/2012  . Abnormal involuntary movement 06/01/2012  . Spinal stenosis 04/03/2012  . SCIATICA 05/14/2010  . URINARY OBSTRUCTION UNSPECIFIED 04/06/2010  . FATIGUE 12/02/2009  . CAD 03/07/2009  . Hypothyroidism 03/01/2009  . HYPOGONADISM 01/15/2009  . Diabetes mellitus type 2, controlled 01/10/2009  . MUSCLE WEAKNESS (GENERALIZED) 01/09/2009  . Seizures 10/01/2008  . BAKER'S CYST, LEFT KNEE 07/24/2008  . ALZHEIMER'S DISEASE, MODERATE 06/19/2008  . BRADYCARDIA 04/04/2008  . DIZZINESS 02/25/2008  . Depressive Disorder, not Elsewhere Classified 05/15/2007  . ABNORMAL STRESS ELECTROCARDIOGRAM 11/14/2006  . OBSTRUCTIVE SLEEP APNEA 05/31/2006  . HYPERLIPIDEMIA 05/16/2006    Medications Prior to Visit Current Outpatient Prescriptions on File Prior to Visit  Medication Sig Dispense Refill  . ALPRAZolam (XANAX) 0.25 MG tablet TAKE ONE TABLET BY MOUTH TWICE A DAY AS NEEDED.  20 tablet  0  . AMBULATORY NON FORMULARY MEDICATION Medication Name: An electric bed with air mattress and rails. [facility]  1 Units  0  . diphenhydrAMINE (BENADRYL) 25 MG tablet Take 50 mg by mouth at bedtime.      Tery Sanfilippo Sodium 100 MG capsule Take 1 tablet (100 mg total) by mouth 2 (two) times daily as needed for constipation.  60 tablet  11  . donepezil (ARICEPT) 10 MG tablet Take 1 tablet (10 mg total) by mouth at bedtime.  90 tablet  3  . ferrous sulfate 325 (65 FE) MG tablet TAKE 1 TABLET (325 MG TOTAL) BY MOUTH DAILY WITH BREAKFAST.  30 tablet  4  . haloperidol (HALDOL) 0.5 MG tablet TAKE 1-2 TABLETS (0.5-1 MG TOTAL) BY MOUTH 2 (TWO) TIMES DAILY AS NEEDED FOR AGITATION.  60 tablet  0  . lamoTRIgine (LAMICTAL) 200 MG tablet TAKE 1  TABLET (200 MG TOTAL) BY MOUTH 2 (TWO) TIMES DAILY.  60 tablet  4  . QUEtiapine (SEROQUEL) 25 MG tablet Take 3 tablets (75 mg total) by mouth at bedtime.  90 tablet  2  . sertraline (ZOLOFT) 100 MG tablet Take 2 tablets (200 mg total) by mouth daily.  60 tablet  2   No current facility-administered medications on file prior to visit.    Current Medications (verified) Current Outpatient Prescriptions  Medication Sig Dispense Refill  . ALPRAZolam (XANAX) 0.25 MG tablet TAKE ONE TABLET BY MOUTH TWICE A DAY AS NEEDED.  20 tablet  0  . AMBULATORY NON FORMULARY MEDICATION Medication Name: An electric bed with air mattress and rails. [facility]  1 Units  0  . diphenhydrAMINE (BENADRYL) 25 MG tablet Take 50 mg by mouth at bedtime.      Tery Sanfilippo. Docusate Sodium 100 MG capsule Take 1 tablet (100 mg total) by mouth 2 (two) times daily as needed for constipation.  60 tablet  11  . donepezil (ARICEPT) 10 MG tablet Take 1 tablet (10 mg total) by mouth at bedtime.  90 tablet  3  . ferrous sulfate 325 (65 FE) MG tablet TAKE 1 TABLET (325 MG TOTAL) BY MOUTH DAILY WITH BREAKFAST.  30 tablet  4  . haloperidol (HALDOL) 0.5 MG tablet TAKE 1-2 TABLETS (0.5-1 MG TOTAL) BY MOUTH 2 (TWO) TIMES DAILY AS NEEDED FOR AGITATION.  60 tablet  0  . lamoTRIgine (LAMICTAL) 200 MG tablet TAKE 1 TABLET (200 MG TOTAL) BY MOUTH 2 (TWO) TIMES DAILY.  60 tablet  4  . QUEtiapine (SEROQUEL) 25 MG tablet Take 3 tablets (75 mg total) by mouth at bedtime.  90 tablet  2  . sertraline (ZOLOFT) 100 MG tablet Take 2 tablets (200 mg total) by mouth daily.  60 tablet  2  . levothyroxine (SYNTHROID, LEVOTHROID) 112 MCG tablet Take 1 tablet (112 mcg total) by mouth daily before breakfast.  90 tablet  0   No current facility-administered medications for this visit.     Allergies (verified) Ativan and Atorvastatin   PAST HISTORY  Family History Family History  Problem Relation Age of Onset  . Dementia Mother     Social  History History  Substance Use Topics  . Smoking status: Former Games developermoker  . Smokeless tobacco: Not on file     Comment: Quit in 1990.   Marland Kitchen. Alcohol Use: No    Are there smokers in your home (other than you)?  No  Risk Factors Current exercise habits: The patient does not participate in regular exercise at present.  Dietary issues discussed: None   Cardiac risk factors: advanced age (older than 6955 for men, 2765 for women), male gender and sedentary lifestyle.  Depression Screen (Note: if answer to either of the following is "Yes", a more complete depression screening is indicated)   Q1: Over the past two weeks, have you felt down, depressed or hopeless? No  Q2: Over the past two  weeks, have you felt little interest or pleasure in doing things? No  Have you lost interest or pleasure in daily life? No  Do you often feel hopeless? No  Do you cry easily over simple problems? No  Activities of Daily Living In your present state of health, do you have any difficulty performing the following activities?:  Driving? Yes Managing money?  Yes Feeding yourself? Yes Getting from bed to chair? Yes Climbing a flight of stairs? Yes Preparing food and eating?: Yes Bathing or showering? Yes Getting dressed: Yes Getting to the toilet? Yes Using the toilet:Yes Moving around from place to place: Yes In the past year have you fallen or had a near fall?:Yes   Are you sexually active?  No  Do you have more than one partner?  No  Hearing Difficulties: Yes Do you often ask people to speak up or repeat themselves? Yes Do you experience ringing or noises in your ears? Yes Do you have difficulty understanding soft or whispered voices? Yes   Do you feel that you have a problem with memory? Yes  Do you often misplace items? Yes  Do you feel safe at home?  Yes  Cognitive Testing  Alert? Yes  Normal Appearance?Yes  Oriented to person? No  Place? No   Time? No  Recall of three objects?  No  Can  perform simple calculations? No  Displays appropriate judgment?No  Can read the correct time from a watch face?No   Advanced Directives have been discussed with the patient? Yes   List the Names of Other Physician/Practitioners you currently use: 1.    Indicate any recent Medical Services you may have received from other than [facility] providers in the past year (date may be approximate).  Immunization History  Administered Date(s) Administered  . Influenza Split 07/11/2011, 06/05/2012  . Influenza Whole 05/08/2006, 05/15/2007, 05/26/2009, 04/07/2010  . Influenza,inj,Quad PF,36+ Mos 06/19/2013, 05/29/2014  . PPD Test 09/25/2012  . Pneumococcal Conjugate-13 05/29/2014  . Pneumococcal Polysaccharide-23 08/08/2005  . Td 01/31/2008  . Zoster 07/14/2011    Screening Tests Health Maintenance  Topic Date Due  . Ophthalmology Exam  11/07/2011  . Urine Microalbumin  06/07/2013  . Hemoglobin A1c  11/28/2014  . Influenza Vaccine  03/09/2015  . Foot Exam  05/30/2015  . Tetanus/tdap  01/30/2018  . Colonoscopy  09/11/2021  . Pneumococcal Polysaccharide Vaccine Age 31 And Over  Completed  . Zostavax  Completed    All answers were reviewed with the patient and necessary referrals were made:  Brinkley Peet, MD   05/30/2014   History reviewed: allergies, current medications, past family history, past medical history, past social history, past surgical history and problem list  Review of Systems A comprehensive review of systems was negative.    Objective:     Vision by Snellen chart: Lat exam is 2 years.   :Blood pressure 127/74, pulse 80, temperature 97.6 F (36.4 C), weight 138 lb (62.596 kg), SpO2 97.00%. Body mass index is 20.99 kg/(m^2).  BP 127/74  Pulse 80  Temp(Src) 97.6 F (36.4 C)  Wt 138 lb (62.596 kg)  SpO2 97% General appearance: alert, cooperative and appears stated age, sitting in wheelchair with indwelling urinary catheter Head: Normocephalic, without  obvious abnormality, atraumatic Eyes: conj clear, EOMI, PEERLA Ears: normal TM's and external ear canals both ears Nose: Nares normal. Septum midline. Mucosa normal. No drainage or sinus tenderness. Throat: lips, mucosa, and tongue normal; teeth and gums normal Neck: no adenopathy, no carotid bruit, no  JVD, supple, symmetrical, trachea midline and thyroid not enlarged, symmetric, no tenderness/mass/nodules Back: symmetric, no curvature. ROM normal. No CVA tenderness. Lungs: clear to auscultation bilaterally Chest wall: no tenderness Heart: regular rate and rhythm, S1, S2 normal, no murmur, click, rub or gallop Abdomen: soft, non-tender; bowel sounds normal; no masses,  no organomegaly Extremities: extremities normal, atraumatic, no cyanosis or edema Pulses: 2+ and symmetric Skin: Skin color, texture, turgor normal. No rashes or lesions Lymph nodes: Cervical adenopathy: normal Neurologic: Mental status: alert, non oriented.      Assessment:     Annual medicare wellness Exam       Plan:     During the course of the visit the patient was educated and counseled about appropriate screening and preventive services including:    Pneumococcal vaccine   Influenza vaccine  Forms completed for Nursing Home  Diet review for nutrition referral? Yes ____  Not Indicated _X__   Patient Instructions (the written plan) was given to the patient.  Medicare Attestation I have personally reviewed: The patient's medical and social history Their use of alcohol, tobacco or illicit drugs Their current medications and supplements The patient's functional ability including ADLs,fall risks, home safety risks, cognitive, and hearing and visual impairment Diet and physical activities Evidence for depression or mood disorders  The patient's weight, height, BMI, and visual acuity have been recorded in the chart.  I have made referrals, counseling, and provided education to the patient based on  review of the above and I have provided the patient with a written personalized care plan for preventive services.     Massai Hankerson, MD   05/30/2014

## 2014-05-29 NOTE — Assessment & Plan Note (Addendum)
Hemoglobin A1c is 6.1, which is stable and well controlled. Continue current regimen. Followup in 3 months. Had normal foot exam.

## 2014-05-29 NOTE — Patient Instructions (Signed)
Keep up a regular exercise program and make sure you are eating a healthy diet Try to eat 4 servings of dairy a day, or if you are lactose intolerant take a calcium with vitamin D daily.  Your vaccines are up to date.   

## 2014-05-29 NOTE — Assessment & Plan Note (Signed)
Non ambulatory.  Able to transfer from bed to chair, but unable to walk

## 2014-05-30 ENCOUNTER — Other Ambulatory Visit: Payer: Self-pay | Admitting: Family Medicine

## 2014-05-30 ENCOUNTER — Other Ambulatory Visit: Payer: Self-pay | Admitting: *Deleted

## 2014-05-30 ENCOUNTER — Telehealth: Payer: Self-pay | Admitting: *Deleted

## 2014-05-30 DIAGNOSIS — E871 Hypo-osmolality and hyponatremia: Secondary | ICD-10-CM

## 2014-05-30 DIAGNOSIS — E039 Hypothyroidism, unspecified: Secondary | ICD-10-CM

## 2014-05-30 LAB — BASIC METABOLIC PANEL WITH GFR
BUN: 10 mg/dL (ref 6–23)
CO2: 25 meq/L (ref 19–32)
Calcium: 9.2 mg/dL (ref 8.4–10.5)
Chloride: 96 mEq/L (ref 96–112)
Creat: 1.2 mg/dL (ref 0.50–1.35)
GFR, Est African American: 67 mL/min
GFR, Est Non African American: 58 mL/min — ABNORMAL LOW
GLUCOSE: 102 mg/dL — AB (ref 70–99)
POTASSIUM: 4.2 meq/L (ref 3.5–5.3)
SODIUM: 129 meq/L — AB (ref 135–145)

## 2014-05-30 LAB — TSH: TSH: 4.726 u[IU]/mL — ABNORMAL HIGH (ref 0.350–4.500)

## 2014-05-30 MED ORDER — LEVOTHYROXINE SODIUM 112 MCG PO TABS: 112.0000 ug | ORAL_TABLET | Freq: Every day | ORAL | Status: DC

## 2014-05-30 NOTE — Assessment & Plan Note (Signed)
Chronic indwelling catheter

## 2014-05-30 NOTE — Telephone Encounter (Signed)
Forms up front ready for p/u.[name], [name]

## 2014-06-03 LAB — SODIUM: SODIUM: 131 meq/L — AB (ref 135–145)

## 2014-06-26 ENCOUNTER — Other Ambulatory Visit: Payer: Self-pay | Admitting: Family Medicine

## 2014-07-07 ENCOUNTER — Telehealth: Payer: Self-pay | Admitting: *Deleted

## 2014-07-07 DIAGNOSIS — F0280 Dementia in other diseases classified elsewhere without behavioral disturbance: Secondary | ICD-10-CM

## 2014-07-07 DIAGNOSIS — R269 Unspecified abnormalities of gait and mobility: Secondary | ICD-10-CM

## 2014-07-07 DIAGNOSIS — R569 Unspecified convulsions: Secondary | ICD-10-CM

## 2014-07-07 DIAGNOSIS — G309 Alzheimer's disease, unspecified: Principal | ICD-10-CM

## 2014-07-07 NOTE — Telephone Encounter (Signed)
Pt's wife called and wanted to inform Dr. Linford ArnoldMetheney that she will need an order for her husband to continue PT. She left the following # [phone]. She is also interested in getting help with his bathing also. I told her that we could place the order however, I could not be sure that her insurance co would pay. she voiced understanding.Tanner PacasBarkley, Tanner Riese MerkelLynetta

## 2014-07-14 NOTE — Telephone Encounter (Signed)
Pt's wife informed that referral has been placed she would [facility]Loralee PacasBarkley, Sephora Boyar [place]

## 2014-07-26 ENCOUNTER — Other Ambulatory Visit: Payer: Self-pay | Admitting: Family Medicine

## 2014-07-29 ENCOUNTER — Telehealth: Payer: Self-pay

## 2014-07-29 NOTE — Telephone Encounter (Signed)
[facility] called to let us know they do not have a Human resources officerpeech Therapist on staff at the moment. They called wife and offered her another company that could meet his needs. She told them she would think about it.

## 2014-07-30 ENCOUNTER — Telehealth: Payer: Self-pay

## 2014-07-30 NOTE — Telephone Encounter (Signed)
Cala BradfordKimberly from [facility] called back to let us know they have found a Speech Therapist and will start therapy soon.

## 2014-08-05 ENCOUNTER — Other Ambulatory Visit: Payer: Self-pay | Admitting: Family Medicine

## 2014-08-06 ENCOUNTER — Telehealth: Payer: Self-pay | Admitting: *Deleted

## 2014-08-06 NOTE — Telephone Encounter (Addendum)
Called [phone] ext. [phone]  and lvm for Tanner DikeJennifer informing that pt (cust # [id number]) is still under Dr. Shelah LewandowskyMetheney's care gave fax #.Loralee PacasBarkley, Kiwanna Spraker [place]

## 2014-08-06 NOTE — Telephone Encounter (Addendum)
[name] with [facility] called and lvm wanting to know if Mr. [name] is currently under Dr. Shelah LewandowskyMetheney's care.Heath Gold.Sampson Self [name] cust # [phone]

## 2014-08-13 ENCOUNTER — Other Ambulatory Visit: Payer: Self-pay | Admitting: Family Medicine

## 2014-08-26 ENCOUNTER — Other Ambulatory Visit: Payer: Self-pay | Admitting: Family Medicine

## 2014-08-27 ENCOUNTER — Telehealth: Payer: Self-pay

## 2014-08-27 NOTE — Telephone Encounter (Signed)
[facility] wanted a verbal order to change his catheter balloon size to 20 cc. I gave verbal ok and they will fax the order to sign. He has had the catheter so long the balloon size is now to small.

## 2014-09-02 ENCOUNTER — Other Ambulatory Visit: Payer: Self-pay | Admitting: Family Medicine

## 2014-09-10 ENCOUNTER — Telehealth: Payer: Self-pay | Admitting: Emergency Medicine

## 2014-09-10 NOTE — Telephone Encounter (Signed)
Wife of patient called to say her husband's social worker will be faxing a FL2 form re: required for nursing home facility.

## 2014-09-10 NOTE — Telephone Encounter (Signed)
error 

## 2014-09-11 ENCOUNTER — Other Ambulatory Visit: Payer: Self-pay | Admitting: Family Medicine

## 2014-09-12 ENCOUNTER — Other Ambulatory Visit: Payer: Self-pay | Admitting: Family Medicine

## 2014-09-16 ENCOUNTER — Telehealth: Payer: Self-pay | Admitting: *Deleted

## 2014-09-16 ENCOUNTER — Other Ambulatory Visit: Payer: Self-pay | Admitting: Family Medicine

## 2014-09-16 NOTE — Telephone Encounter (Signed)
French Anaracy w/[facility] called and lvm stating that pt's wife has said that he has been more agitated and would like order for CNS for ?uti she said that he is not as agitated right now.

## 2014-09-17 ENCOUNTER — Other Ambulatory Visit: Payer: Self-pay | Admitting: Family Medicine

## 2014-09-20 ENCOUNTER — Other Ambulatory Visit: Payer: Self-pay | Admitting: Family Medicine

## 2014-09-23 ENCOUNTER — Ambulatory Visit (INDEPENDENT_AMBULATORY_CARE_PROVIDER_SITE_OTHER): Payer: Medicare Other | Admitting: Family Medicine

## 2014-09-23 ENCOUNTER — Encounter: Payer: Self-pay | Admitting: Family Medicine

## 2014-09-23 VITALS — BP 130/65 | HR 83 | Wt 138.0 lb

## 2014-09-23 DIAGNOSIS — F329 Major depressive disorder, single episode, unspecified: Secondary | ICD-10-CM

## 2014-09-23 DIAGNOSIS — E611 Iron deficiency: Secondary | ICD-10-CM

## 2014-09-23 DIAGNOSIS — E119 Type 2 diabetes mellitus without complications: Secondary | ICD-10-CM

## 2014-09-23 DIAGNOSIS — D509 Iron deficiency anemia, unspecified: Secondary | ICD-10-CM

## 2014-09-23 DIAGNOSIS — F0393 Unspecified dementia, unspecified severity, with mood disturbance: Secondary | ICD-10-CM

## 2014-09-23 DIAGNOSIS — F0280 Dementia in other diseases classified elsewhere without behavioral disturbance: Secondary | ICD-10-CM

## 2014-09-23 DIAGNOSIS — E038 Other specified hypothyroidism: Secondary | ICD-10-CM

## 2014-09-23 DIAGNOSIS — E871 Hypo-osmolality and hyponatremia: Secondary | ICD-10-CM

## 2014-09-23 LAB — POCT GLYCOSYLATED HEMOGLOBIN (HGB A1C): Hemoglobin A1C: 6.3

## 2014-09-23 NOTE — Patient Instructions (Addendum)
Decrease Sertraline to 1/2 tab twice a day for 2 weeks, then 1/2 tab once a day for 2 weeks, then 1/2 tab every other day for 2 weeks and then stop

## 2014-09-23 NOTE — Progress Notes (Addendum)
   Subjective:    Patient ID: [name], male    DOB: 19-Mar-1936, 79 y.o.   MRN: [id number]  HPI Diabetes - no hypoglycemic events. No wounds or sores that are not healing well. No increased thirst. Checking glucose at home. Taking medications as prescribed without any side effects.  Hypothyroid - increased thyroid medication in October to 112 g. Due to recheck thyroid levels. No change in energy level skin or hair.   Hyponatermia- low sodium was noted on labs back in October. We cut the sertraline down to 1-1/2 tabs from 2 tabs daily. They were supposed to recheck his sodium in 2 weeks but has not been back since. They have not noticed any change in mentation etc.  Depression-his wife says she hasn't really noticed much difference with the sertraline as far as controlling mood. Recently decreasing the dose because of low sodium has not altered his mood.  Abnormality of gait with lower extremity weakness. He is not ambulatory and currently uses a wheelchair. He starting to gradually get some weakness in his upper extremities as well. His wife would like to have a trapeze bar so that he can help pull himself up in bed.  Review of Systems     Objective:   Physical Exam  Constitutional: He is oriented to person, place, and time. He appears well-developed and well-nourished.  HENT:  Head: Normocephalic and atraumatic.  Right Ear: External ear normal.  Left Ear: External ear normal.  Neck: Neck supple. No thyromegaly present.  Cardiovascular: Normal rate, regular rhythm and normal heart sounds.   Pulmonary/Chest: Effort normal and breath sounds normal.  Abdominal: Soft. Bowel sounds are normal. He exhibits no distension and no mass. There is no tenderness. There is no rebound and no guarding.  Lymphadenopathy:    He has no cervical adenopathy.  Neurological: He is alert and oriented to person, place, and time.  Skin: Skin is warm and dry.  Psychiatric: He has a normal mood and affect.  His behavior is normal.          Assessment & Plan:  DM- A1C is 6.3 . wel controlled. Continue current regimen. Follow-up in 3-4 months.  Hypothyroidism-due to recheck TSH since we adjusted his dose about 4 months ago.  Hyponatremia-hopefully will be improved after reducing the sertraline. Next  Depression-we'll go ahead and taper the sertraline since effective.   Abnormality of gait/upper and lower extremity weakness with discoordination-recommend trapeze bar.  We'll see if we can coordinate through home health. This will help his wife who has her own medical problems and is unable to put him up or lift him.

## 2014-09-24 LAB — BASIC METABOLIC PANEL
BUN: 13 mg/dL (ref 6–23)
CHLORIDE: 101 meq/L (ref 96–112)
CO2: 27 mEq/L (ref 19–32)
Calcium: 8.8 mg/dL (ref 8.4–10.5)
Creat: 1.17 mg/dL (ref 0.50–1.35)
Glucose, Bld: 132 mg/dL — ABNORMAL HIGH (ref 70–99)
Potassium: 4.4 mEq/L (ref 3.5–5.3)
Sodium: 137 mEq/L (ref 135–145)

## 2014-09-24 LAB — FERRITIN: Ferritin: 164 ng/mL (ref 22–322)

## 2014-09-24 LAB — IRON: Iron: 52 ug/dL (ref 42–165)

## 2014-09-24 LAB — TSH: TSH: 3.05 u[IU]/mL (ref 0.350–4.500)

## 2014-09-25 ENCOUNTER — Other Ambulatory Visit: Payer: Self-pay | Admitting: *Deleted

## 2014-09-25 DIAGNOSIS — E039 Hypothyroidism, unspecified: Secondary | ICD-10-CM

## 2014-09-25 MED ORDER — LEVOTHYROXINE SODIUM 112 MCG PO TABS: 112.0000 ug | ORAL_TABLET | Freq: Every day | ORAL | Status: DC

## 2014-10-06 DIAGNOSIS — R296 Repeated falls: Secondary | ICD-10-CM

## 2014-10-06 DIAGNOSIS — Z87440 Personal history of urinary (tract) infections: Secondary | ICD-10-CM

## 2014-10-06 DIAGNOSIS — G309 Alzheimer's disease, unspecified: Secondary | ICD-10-CM

## 2014-10-06 DIAGNOSIS — F0280 Dementia in other diseases classified elsewhere without behavioral disturbance: Secondary | ICD-10-CM

## 2014-10-14 ENCOUNTER — Other Ambulatory Visit: Payer: Self-pay | Admitting: Family Medicine

## 2014-10-15 ENCOUNTER — Telehealth: Payer: Self-pay | Admitting: *Deleted

## 2014-10-15 NOTE — Telephone Encounter (Signed)
Calling about an appeal for additional clinical information she did get the info from 2/16 she will need a statement that say he needs skilled facility care box 15 was left unchecked write brief statement indicating his needs. I asked if we could just resubmit the form. She checked on this and stated that this could be done I downloaded the form and faxed it back to her .Loralee PacasBarkley, Dorinne Graeff [place]

## 2014-10-18 ENCOUNTER — Other Ambulatory Visit: Payer: Self-pay | Admitting: Family Medicine

## 2014-10-23 ENCOUNTER — Other Ambulatory Visit: Payer: Self-pay | Admitting: Family Medicine

## 2014-11-06 ENCOUNTER — Other Ambulatory Visit: Payer: Self-pay | Admitting: *Deleted

## 2014-11-06 DIAGNOSIS — M4800 Spinal stenosis, site unspecified: Secondary | ICD-10-CM

## 2014-11-06 MED ORDER — AMBULATORY NON FORMULARY MEDICATION: Status: DC

## 2014-11-10 ENCOUNTER — Telehealth: Payer: Self-pay | Admitting: *Deleted

## 2014-11-10 NOTE — Telephone Encounter (Signed)
[facility]y [facility] stating that they need office notes stating that pt needs the trapeze bar but I don't see anything in his chart.

## 2014-11-12 ENCOUNTER — Telehealth: Payer: Self-pay | Admitting: *Deleted

## 2014-11-12 NOTE — Telephone Encounter (Signed)
Okay to fax note from February 16th.

## 2014-11-12 NOTE — Telephone Encounter (Signed)
Current dose is 1-2 tablets twice a day.  Increase to 1-3 tablets twice a day to see if helps with agitation.   FYI Dr. Linford ArnoldMetheney if you would like to make additional changes.

## 2014-11-12 NOTE — Telephone Encounter (Signed)
Dr. Linford ArnoldMetheney writes his Haldol.

## 2014-11-12 NOTE — Telephone Encounter (Signed)
LMOM notifying pt's wife of haldol increase.

## 2014-11-12 NOTE — Telephone Encounter (Signed)
He appears he see Dr. Dreama SaaAtkar downstairs. Needs to contact them regarding psych medications.

## 2014-11-12 NOTE — Telephone Encounter (Signed)
Pt's wife called today stating that [name] is becoming more and more agitated and angry.  He's currently on Haldol now but she's wondering if he needs something stronger or an increase in the dosage.  Please advise.

## 2014-11-12 NOTE — Telephone Encounter (Signed)
[facility] [phone].

## 2014-11-17 ENCOUNTER — Telehealth: Payer: Self-pay

## 2014-11-17 NOTE — Telephone Encounter (Signed)
Patient's wife called and needs skilled nursing for [name]. He needs his catheter removed and replaced. [facility] and [facility] do not have the staff available for this request and has declined the referral.   [facility] and they will go out on Thursday for monthly removal and replacement of catheter. Printed all paperwork needed and placed in box for Dr [name] to sign. [facility] expects to see [name] on Thursday.   [facility]  Home Health Care Service  Address: [address], [place], [place] [address]  [phone] New Patient: [phone] Fax: [phone]

## 2014-11-27 ENCOUNTER — Telehealth: Payer: Self-pay

## 2014-11-27 ENCOUNTER — Other Ambulatory Visit: Payer: Self-pay | Admitting: Family Medicine

## 2014-11-27 NOTE — Telephone Encounter (Signed)
Patient's wife called and reports he is not sleeping through the night and becomes very agitated. Please advise.

## 2014-11-28 NOTE — Telephone Encounter (Signed)
Is he napping and nodding off during teh daytime.?

## 2014-12-02 NOTE — Telephone Encounter (Signed)
Is he still taking serquel at bedtime?  We can stop the seroquel and switch to tramadol instead of she is ok with that.

## 2014-12-02 NOTE — Telephone Encounter (Signed)
His wife reports he doesn't nod off during the daytime.

## 2014-12-03 NOTE — Telephone Encounter (Signed)
lvm asking pt to rtn call about medications.Tanner PacasBarkley, [name]

## 2014-12-08 NOTE — Telephone Encounter (Signed)
Pt's wife called back and stated that he has been sleeping at night and doesn't feel a need to switch his meds. She did state that he is having mood swings during the day and wanted to know if there was anything that she could give him for this? I advised her that Dr. Linford ArnoldMetheney would be out of the office for the rest of the week and would get this message once she returned. She stated that would be ok.Heath GoldBarkley, Najmah Carradine [name]'

## 2014-12-11 ENCOUNTER — Other Ambulatory Visit: Payer: Self-pay | Admitting: Family Medicine

## 2014-12-15 NOTE — Telephone Encounter (Signed)
I really wouldn't add any other pscyh meds to his regime. In fact recommend dec seroquel to 50mg . I think has been taking 75mg  at bedtime.  I would really like to get him off this med copmpletely over time.

## 2014-12-20 ENCOUNTER — Other Ambulatory Visit: Payer: Self-pay | Admitting: Family Medicine

## 2014-12-25 NOTE — Telephone Encounter (Signed)
Pt's wife informed.[name]  

## 2015-01-08 ENCOUNTER — Other Ambulatory Visit: Payer: Self-pay | Admitting: Family Medicine

## 2015-01-17 ENCOUNTER — Other Ambulatory Visit: Payer: Self-pay | Admitting: Family Medicine

## 2015-02-10 ENCOUNTER — Other Ambulatory Visit: Payer: Self-pay | Admitting: Family Medicine

## 2015-02-14 ENCOUNTER — Other Ambulatory Visit: Payer: Self-pay | Admitting: Family Medicine

## 2015-02-16 ENCOUNTER — Other Ambulatory Visit: Payer: Self-pay | Admitting: Family Medicine

## 2015-02-16 ENCOUNTER — Other Ambulatory Visit: Payer: Self-pay | Admitting: *Deleted

## 2015-02-17 ENCOUNTER — Other Ambulatory Visit: Payer: Self-pay | Admitting: Family Medicine

## 2015-03-12 ENCOUNTER — Other Ambulatory Visit: Payer: Self-pay | Admitting: Family Medicine

## 2015-03-14 ENCOUNTER — Other Ambulatory Visit: Payer: Self-pay | Admitting: Family Medicine

## 2015-03-24 ENCOUNTER — Telehealth: Payer: Self-pay | Admitting: *Deleted

## 2015-03-24 NOTE — Telephone Encounter (Signed)
Pt's wife called and stated that the [facility] nurses are advising her that he will need a supra pubic cath

## 2015-03-25 NOTE — Telephone Encounter (Signed)
lvm asking that pt rtn call about where she would like pt to be referred to for cath placement.[name] [place]

## 2015-04-02 ENCOUNTER — Telehealth: Payer: Self-pay | Admitting: Family Medicine

## 2015-04-02 NOTE — Telephone Encounter (Signed)
Call patient's wife: There is a black box warning with increased risk of death in older patients who take things like the Seroquel. I know he's using it primarily to help him rest and sleep. If he's doing well then I would like him to try to decrease down to 1 tab at bedtime and eventually try to get off of the Seroquel.

## 2015-04-08 ENCOUNTER — Other Ambulatory Visit: Payer: Self-pay

## 2015-04-08 MED ORDER — QUETIAPINE FUMARATE 25 MG PO TABS: 75.0000 mg | ORAL_TABLET | Freq: Every day | ORAL | Status: DC

## 2015-04-08 MED ORDER — QUETIAPINE FUMARATE 25 MG PO TABS
50.0000 mg | ORAL_TABLET | Freq: Every day | ORAL | Status: DC
Start: 2015-04-08 — End: 2015-06-15

## 2015-04-08 NOTE — Telephone Encounter (Signed)
Patient's wife advised

## 2015-05-04 ENCOUNTER — Other Ambulatory Visit: Payer: Self-pay | Admitting: Family Medicine

## 2015-05-14 ENCOUNTER — Telehealth: Payer: Self-pay | Admitting: *Deleted

## 2015-05-14 NOTE — Telephone Encounter (Signed)
Marylene Land called and stated that he has had a supra pubic cath place and she doesn't know managing the changing of this. She stated that she has not been able to get in contact with his wife to obtain this information. She stated that she will keep trying to get in touch with his wife and give [place] a call back.[name] [place]

## 2015-05-22 ENCOUNTER — Other Ambulatory Visit: Payer: Self-pay | Admitting: Family Medicine

## 2015-05-26 ENCOUNTER — Other Ambulatory Visit: Payer: Self-pay | Admitting: Family Medicine

## 2015-06-13 ENCOUNTER — Other Ambulatory Visit: Payer: Self-pay | Admitting: Family Medicine

## 2015-06-15 ENCOUNTER — Other Ambulatory Visit: Payer: Self-pay | Admitting: Family Medicine

## 2015-06-22 ENCOUNTER — Ambulatory Visit (INDEPENDENT_AMBULATORY_CARE_PROVIDER_SITE_OTHER): Payer: Medicare Other | Admitting: Family Medicine

## 2015-06-22 DIAGNOSIS — Z23 Encounter for immunization: Secondary | ICD-10-CM

## 2015-06-22 DIAGNOSIS — N138 Other obstructive and reflux uropathy: Secondary | ICD-10-CM

## 2015-06-22 DIAGNOSIS — E119 Type 2 diabetes mellitus without complications: Secondary | ICD-10-CM

## 2015-06-22 DIAGNOSIS — F0280 Dementia in other diseases classified elsewhere without behavioral disturbance: Secondary | ICD-10-CM

## 2015-06-22 DIAGNOSIS — E039 Hypothyroidism, unspecified: Secondary | ICD-10-CM

## 2015-06-22 DIAGNOSIS — G301 Alzheimer's disease with late onset: Secondary | ICD-10-CM | POA: Diagnosis not present

## 2015-06-22 DIAGNOSIS — N401 Enlarged prostate with lower urinary tract symptoms: Secondary | ICD-10-CM

## 2015-06-22 LAB — POCT GLYCOSYLATED HEMOGLOBIN (HGB A1C): HEMOGLOBIN A1C: 5.8

## 2015-06-22 MED ORDER — QUETIAPINE FUMARATE 25 MG PO TABS: 25.0000 mg | ORAL_TABLET | Freq: Every day | ORAL | Status: DC

## 2015-06-22 NOTE — Progress Notes (Signed)
   Subjective:    Patient ID: [name], male    DOB: Dec 24, 1935, 79 y.o.   MRN: [id number]  HPI  [name].   -  Memory is on and off.  Some days he bables and some does he does well.  Last "episodes"lasted about 2 days.    Follow-up depression-. He's currently on sertraline milligrams and doing well. He has not had any depressive episodes recently. He is still taking 25 g of Seroquel at bedtime.  DM - no inc thirt or urination.  Not on medications.  In fact we stopped his diabetic medications previously because he was having some hypoglycemic events.  Left arm is painful and he really doesn't use it anymore. The pain management doc has him on chronic pain medications.  [name] [place].  He is getting a steroid shot in Wednesday. He is on chronic pain medications for this.  Has a chronic catheter that i snow suprapbuci.   He has an area of skin that is not healing well. His caretaker wanted note there any strategies to help this heal.  Review of Systems     Objective:   Physical Exam  Constitutional: He is oriented to person, place, and time. He appears well-developed and well-nourished.  HENT:  Head: Normocephalic and atraumatic.  Cardiovascular: Normal rate, regular rhythm and normal heart sounds.   Pulmonary/Chest: Effort normal and breath sounds normal.  Neurological: He is alert and oriented to person, place, and time.  Skin: Skin is warm and dry.  Psychiatric: He has a normal mood and affect. His behavior is normal.          Assessment & Plan:  Alzheimer's dementia-continue with Aricept. Overall I think he stable.  DM - well-controlled off her diabetic medication. We'll continue to monitor at least yearly if not every 6 months.  Depression-we'll continue to wean the Seroquel. Decrease to 1 tab at bedtime for the next week or so and if he is doing well then decrease to every other night for about a week and then stop the medication  completely. We'll try to taper this over the next couple of weeks.  Chronic urinary catheter, suprapubic-recommend using Xeroform gauze just around the granulation tissue area that keeps getting irritated and see if this helps that he'll of the next week or 2. If not improving then recommend a follow-up with his urologist.

## 2015-06-23 ENCOUNTER — Encounter: Payer: Self-pay | Admitting: Family Medicine

## 2015-07-03 ENCOUNTER — Other Ambulatory Visit: Payer: Self-pay | Admitting: Family Medicine

## 2015-07-08 LAB — COMPLETE METABOLIC PANEL WITH GFR
ALT: 8 U/L — ABNORMAL LOW (ref 9–46)
AST: 11 U/L (ref 10–35)
Albumin: 3.4 g/dL — ABNORMAL LOW (ref 3.6–5.1)
Alkaline Phosphatase: 91 U/L (ref 40–115)
BILIRUBIN TOTAL: 0.4 mg/dL (ref 0.2–1.2)
BUN: 19 mg/dL (ref 7–25)
CHLORIDE: 100 mmol/L (ref 98–110)
CO2: 29 mmol/L (ref 20–31)
Calcium: 9.2 mg/dL (ref 8.6–10.3)
Creat: 1.1 mg/dL (ref 0.70–1.18)
GFR, EST AFRICAN AMERICAN: 74 mL/min (ref >=60)
GFR, EST NON AFRICAN AMERICAN: 64 mL/min (ref >=60)
Glucose, Bld: 115 mg/dL — ABNORMAL HIGH (ref 65–99)
Potassium: 4.3 mmol/L (ref 3.5–5.3)
Sodium: 137 mmol/L (ref 135–146)
TOTAL PROTEIN: 7.3 g/dL (ref 6.1–8.1)

## 2015-07-08 LAB — TSH: TSH: 9.347 u[IU]/mL — ABNORMAL HIGH (ref 0.350–4.500)

## 2015-07-09 ENCOUNTER — Telehealth: Payer: Self-pay

## 2015-07-09 NOTE — Telephone Encounter (Signed)
I would give it a good 7-10 days on the decreased dose to let his body re-equilibrate.

## 2015-07-09 NOTE — Telephone Encounter (Signed)
Wife of patient called stating pt is not sleeping with the decrease of Seroquel. Please advise.

## 2015-09-14 ENCOUNTER — Other Ambulatory Visit: Payer: Self-pay | Admitting: Family Medicine

## 2015-10-05 ENCOUNTER — Other Ambulatory Visit: Payer: Self-pay | Admitting: Family Medicine

## 2015-10-05 ENCOUNTER — Telehealth: Payer: Self-pay | Admitting: *Deleted

## 2015-10-05 NOTE — Telephone Encounter (Signed)
Due for f/u appt

## 2015-10-05 NOTE — Telephone Encounter (Signed)
Called patient's wife to verify if patient is taking one tablet or two tablets of the seroquel. Per mrs. Buda the patient is still taking two tablets and was unable to taper down.

## 2015-11-04 ENCOUNTER — Other Ambulatory Visit: Payer: Self-pay | Admitting: Family Medicine

## 2015-11-17 ENCOUNTER — Other Ambulatory Visit: Payer: Self-pay | Admitting: Family Medicine

## 2015-11-19 ENCOUNTER — Other Ambulatory Visit: Payer: Self-pay | Admitting: Family Medicine

## 2015-12-13 ENCOUNTER — Other Ambulatory Visit: Payer: Self-pay | Admitting: Family Medicine

## 2016-02-12 ENCOUNTER — Other Ambulatory Visit: Payer: Self-pay | Admitting: *Deleted

## 2016-02-12 MED ORDER — LAMOTRIGINE 200 MG PO TABS: ORAL_TABLET | ORAL | Status: DC

## 2016-02-23 ENCOUNTER — Telehealth: Payer: Self-pay | Admitting: Family Medicine

## 2016-02-23 NOTE — Telephone Encounter (Signed)
Called pt and left voicemail stating he is due for his fu appt. Thanks

## 2016-02-27 ENCOUNTER — Other Ambulatory Visit: Payer: Self-pay | Admitting: Family Medicine

## 2016-03-13 ENCOUNTER — Other Ambulatory Visit: Payer: Self-pay | Admitting: Family Medicine

## 2016-03-24 ENCOUNTER — Ambulatory Visit (INDEPENDENT_AMBULATORY_CARE_PROVIDER_SITE_OTHER): Payer: Medicare Other | Admitting: Family Medicine

## 2016-03-24 ENCOUNTER — Encounter: Payer: Self-pay | Admitting: Family Medicine

## 2016-03-24 VITALS — BP 107/71 | HR 75

## 2016-03-24 DIAGNOSIS — Z23 Encounter for immunization: Secondary | ICD-10-CM | POA: Diagnosis not present

## 2016-03-24 DIAGNOSIS — M159 Polyosteoarthritis, unspecified: Secondary | ICD-10-CM

## 2016-03-24 DIAGNOSIS — M7918 Myalgia, other site: Secondary | ICD-10-CM

## 2016-03-24 DIAGNOSIS — M25512 Pain in left shoulder: Secondary | ICD-10-CM

## 2016-03-24 DIAGNOSIS — M150 Primary generalized (osteo)arthritis: Secondary | ICD-10-CM | POA: Diagnosis not present

## 2016-03-24 DIAGNOSIS — G4700 Insomnia, unspecified: Secondary | ICD-10-CM

## 2016-03-24 DIAGNOSIS — E785 Hyperlipidemia, unspecified: Secondary | ICD-10-CM

## 2016-03-24 DIAGNOSIS — E119 Type 2 diabetes mellitus without complications: Secondary | ICD-10-CM | POA: Diagnosis not present

## 2016-03-24 DIAGNOSIS — M791 Myalgia: Secondary | ICD-10-CM

## 2016-03-24 DIAGNOSIS — E039 Hypothyroidism, unspecified: Secondary | ICD-10-CM

## 2016-03-24 LAB — POCT GLYCOSYLATED HEMOGLOBIN (HGB A1C): HEMOGLOBIN A1C: 5.5

## 2016-03-24 MED ORDER — MELOXICAM 15 MG PO TABS: 15.0000 mg | ORAL_TABLET | Freq: Every day | ORAL | 0 refills | Status: DC | PRN

## 2016-03-24 MED ORDER — DICLOFENAC SODIUM 1 % TD GEL: 2.0000 g | Freq: Four times a day (QID) | TRANSDERMAL | 2 refills | Status: AC | PRN

## 2016-03-24 MED ORDER — AMBULATORY NON FORMULARY MEDICATION: 0 refills | Status: AC

## 2016-03-24 MED ORDER — DOXEPIN HCL 10 MG PO CAPS: 10.0000 mg | ORAL_CAPSULE | Freq: Every day | ORAL | 2 refills | Status: DC

## 2016-03-24 NOTE — Progress Notes (Addendum)
Subjective:    CC: COPD, DM  HPI:   Diabetes - no hypoglycemic events. No wounds or sores that are not healing well. No increased thirst or urination. Checking glucose at home. Taking medications as prescribed without any side effects.  He does have intermittent shoulder pain and low back and buttock pain. [facility] Dr. Marca [place]. Though he hasn't been in 6 months. His wife and caretaker who is here today report that he actually uses his oxycodone very infrequently. In fact he is actually been out of the medication for almost 2 months. His caretaker usually just gives him an adult if he complains of pain. He wants to know if there are other options for pain control or if I would be willing to take over the oxycodone.  Insomnia - they have tried stopping the Seroquel for sleep and unfortunately he was unable to sleep without it and became very disoriented. Thus they decided to restart it. He also takes 2 Benadryl at bedtime. They his caretaker admits that he pretty much goes to sleep when he wants to in most days sleep until 3 to 4:00 in the afternoon. Has a very irregular sleep schedule. And he does consume a fair amount of caffeine.  She is nonambulatory. He needs assistance with transitioning from bed to wheelchair.  Past medical history, Surgical history, Family history not pertinant except as noted below, Social history, Allergies, and medications have been entered into the medical record, reviewed, and corrections made.   Review of Systems: No fevers, chills, night sweats, weight loss, chest pain, or shortness of breath.   Objective:    General: Well Developed, well nourished, and in no acute distress.  Neuro: Alert and oriented x3, extra-ocular muscles intact, sensation grossly intact.  HEENT: Normocephalic, atraumatic  Skin: Warm and dry, no rashes. Cardiac: Regular rate and rhythm, no murmurs rubs or gallops, no lower extremity edema.   Respiratory: Clear to auscultation bilaterally. Not using accessory muscles, speaking in full sentences.   Impression and Recommendations:   DM- Well controlled. A1C of 5.5, down from previous.  Continue current regimen. Follow up in  6 mo. Not on meds.    Insomnia - we discussed again that I would really like for him to discontinue this  for sleep. Explained that there is an increased risk for death with this medication being used in elderly patients with dementia. Recommend a trial of low-dose doxepin for sleep and avoiding all caffeine. Also discussed having a more consistent bed and wake time.  Pain in the left shoulder, low back and bottom - we discussed several options. I think he would actually be a great candidate for Voltaren gel. This could definitely be applied to the shoulder and even the low back area. As far soreness on his bottom I think he would really benefit from a gel cushion custom fit for his wheelchair.  Dementia - on aricpet . Will continue for now but doubt how much benefit getting at this point.    Depression - PHQ- 9 score of 12. He is back on his lexapro.  Takes it sporadically.

## 2016-04-12 ENCOUNTER — Other Ambulatory Visit: Payer: Self-pay | Admitting: Family Medicine

## 2016-04-24 ENCOUNTER — Other Ambulatory Visit: Payer: Self-pay | Admitting: Family Medicine

## 2016-05-04 ENCOUNTER — Other Ambulatory Visit: Payer: Self-pay | Admitting: Family Medicine

## 2016-05-05 ENCOUNTER — Telehealth: Payer: Self-pay | Admitting: Family Medicine

## 2016-05-05 LAB — COMPLETE METABOLIC PANEL WITH GFR
ALBUMIN: 3.4 g/dL — AB (ref 3.6–5.1)
ALK PHOS: 71 U/L (ref 40–115)
ALT: 25 U/L (ref 9–46)
AST: 17 U/L (ref 10–35)
BILIRUBIN TOTAL: 0.4 mg/dL (ref 0.2–1.2)
BUN: 17 mg/dL (ref 7–25)
CO2: 28 mmol/L (ref 20–31)
Calcium: 8.9 mg/dL (ref 8.6–10.3)
Chloride: 108 mmol/L (ref 98–110)
Creat: 0.88 mg/dL (ref 0.70–1.18)
GFR, Est African American: >89 mL/min (ref >=60)
GFR, Est Non African American: 82 mL/min (ref >=60)
GLUCOSE: 84 mg/dL (ref 65–99)
POTASSIUM: 4 mmol/L (ref 3.5–5.3)
SODIUM: 143 mmol/L (ref 135–146)
TOTAL PROTEIN: 6.6 g/dL (ref 6.1–8.1)

## 2016-05-05 LAB — LIPID PANEL
CHOL/HDL RATIO: 3.7 ratio (ref <=5.0)
CHOLESTEROL: 154 mg/dL (ref 125–200)
HDL: 42 mg/dL (ref >=40)
LDL Cholesterol: 90 mg/dL (ref <130)
Triglycerides: 109 mg/dL (ref <150)
VLDL: 22 mg/dL (ref <30)

## 2016-05-05 LAB — TSH: TSH: 1.76 m[IU]/L (ref 0.40–4.50)

## 2016-05-05 NOTE — Telephone Encounter (Signed)
Spoke with Pt's wife regarding lab results, while on the phone she advised the Rx for doxepin did not work. Wife states the Pt and his caregiver were up all night for 2 nights before they made the decision to switch him back to Seroquel. Now requesting a new refill on the Seroquel Rx. Will route to PCP for review, pharmacy on file is correct.

## 2016-05-05 NOTE — Telephone Encounter (Signed)
OK to refill.  Should be on med list.

## 2016-05-06 MED ORDER — QUETIAPINE FUMARATE 25 MG PO TABS: 50.0000 mg | ORAL_TABLET | Freq: Every day | ORAL | 2 refills | Status: DC

## 2016-05-06 NOTE — Telephone Encounter (Signed)
New Rx sent.

## 2016-06-01 ENCOUNTER — Other Ambulatory Visit: Payer: Self-pay | Admitting: *Deleted

## 2016-06-01 MED ORDER — LEVOTHYROXINE SODIUM 112 MCG PO TABS: ORAL_TABLET | ORAL | 1 refills | Status: DC

## 2016-06-01 NOTE — Progress Notes (Signed)
Request for 90 day  supply

## 2016-06-12 ENCOUNTER — Other Ambulatory Visit: Payer: Self-pay | Admitting: Family Medicine

## 2016-06-13 ENCOUNTER — Other Ambulatory Visit: Payer: Self-pay | Admitting: Family Medicine

## 2016-06-14 ENCOUNTER — Other Ambulatory Visit: Payer: Self-pay | Admitting: *Deleted

## 2016-06-14 MED ORDER — DONEPEZIL HCL 10 MG PO TABS: ORAL_TABLET | ORAL | 1 refills | Status: AC

## 2016-06-16 ENCOUNTER — Telehealth: Payer: Self-pay | Admitting: *Deleted

## 2016-06-16 NOTE — Telephone Encounter (Signed)
Pt's wife lvm stating that she has requested that [name]'s caregiver bring him back home and he has refused. She stated that she has gotten the police involved and would like Dr. Linford ArnoldMetheney to write a letter attesting to [name]'s mental capacity.[name], [name]

## 2016-06-16 NOTE — Telephone Encounter (Signed)
lvm informing pt's wife that we can write the letter for her. However, I would like to get more information if possible.Loralee PacasBarkley, Cele Mote EmersonLynetta

## 2016-06-26 ENCOUNTER — Other Ambulatory Visit: Payer: Self-pay | Admitting: Family Medicine

## 2016-07-21 ENCOUNTER — Other Ambulatory Visit: Payer: Self-pay | Admitting: Family Medicine

## 2016-07-25 ENCOUNTER — Other Ambulatory Visit: Payer: Self-pay | Admitting: Family Medicine

## 2016-08-07 ENCOUNTER — Other Ambulatory Visit: Payer: Self-pay | Admitting: Family Medicine

## 2016-08-11 ENCOUNTER — Other Ambulatory Visit: Payer: Self-pay | Admitting: *Deleted

## 2016-08-11 MED ORDER — LEVOTHYROXINE SODIUM 112 MCG PO TABS: ORAL_TABLET | ORAL | 0 refills | Status: AC

## 2016-08-11 MED ORDER — QUETIAPINE FUMARATE 25 MG PO TABS: 50.0000 mg | ORAL_TABLET | Freq: Every day | ORAL | 0 refills | Status: AC

## 2016-08-24 ENCOUNTER — Other Ambulatory Visit: Payer: Self-pay | Admitting: Family Medicine

## 2016-09-02 ENCOUNTER — Telehealth: Payer: Self-pay | Admitting: Family Medicine

## 2016-09-02 DIAGNOSIS — L8990 Pressure ulcer of unspecified site, unspecified stage: Secondary | ICD-10-CM

## 2016-09-02 NOTE — Telephone Encounter (Signed)
Referral placed.

## 2016-09-02 NOTE — Telephone Encounter (Signed)
Ok for order?  

## 2016-09-02 NOTE — Telephone Encounter (Signed)
Patients wife called requested a referral for home nursing/wound care. Pt is bed bound and now has bed sores. Pt currently has [facility] out of [place] for care of his suprapubic catheter. Will route to PCP for review.

## 2016-09-07 ENCOUNTER — Other Ambulatory Visit: Payer: Self-pay | Admitting: Family Medicine

## 2016-09-07 ENCOUNTER — Telehealth: Payer: Self-pay | Admitting: *Deleted

## 2016-09-07 DIAGNOSIS — L89159 Pressure ulcer of sacral region, unspecified stage: Secondary | ICD-10-CM

## 2016-09-07 DIAGNOSIS — R262 Difficulty in walking, not elsewhere classified: Secondary | ICD-10-CM

## 2016-09-07 NOTE — Progress Notes (Signed)
Wife called. Patient is now nonambulatory and so has been getting some bedsores. He is actually not eating and so the family has been feeding him through a syringe. He does have a suprapubic catheter in home health discharged him back in September so they've been trying to manage this on their own. His new referral for home health as well as social work. May also need a palliative care consult. [name], MD

## 2016-09-07 NOTE — Telephone Encounter (Signed)
[name] from [facility] called and states she faxed over a notification that they would not be able to take referral for wound care. She was just calling to make sure you were aware.

## 2016-09-08 ENCOUNTER — Other Ambulatory Visit: Payer: Self-pay | Admitting: Family Medicine

## 2016-09-08 ENCOUNTER — Ambulatory Visit: Payer: Self-pay | Admitting: Family Medicine

## 2016-09-08 DIAGNOSIS — R300 Dysuria: Secondary | ICD-10-CM

## 2016-09-08 NOTE — Telephone Encounter (Signed)
Will enter new referral.

## 2016-09-09 LAB — URINALYSIS, ROUTINE W REFLEX MICROSCOPIC
BILIRUBIN URINE: NEGATIVE
Hgb urine dipstick: NEGATIVE
Ketones, ur: NEGATIVE
NITRITE: POSITIVE — AB
Specific Gravity, Urine: 1.023 (ref 1.001–1.035)
pH: 8.5 — ABNORMAL HIGH (ref 5.0–8.0)

## 2016-09-09 LAB — URINALYSIS, MICROSCOPIC ONLY
BACTERIA UA: NONE SEEN [HPF]
Casts: NONE SEEN [LPF]
Yeast: NONE SEEN [HPF]

## 2016-09-10 LAB — URINE CULTURE

## 2016-09-14 ENCOUNTER — Telehealth: Payer: Self-pay | Admitting: *Deleted

## 2016-09-14 NOTE — Telephone Encounter (Signed)
Pt's wife called and asked that Dr. Linford ArnoldMetheney send order for him to be placed for hospice in [place].[name].[name], [name]

## 2016-09-15 ENCOUNTER — Telehealth: Payer: Self-pay | Admitting: Family Medicine

## 2016-09-15 MED ORDER — AMBULATORY NON FORMULARY MEDICATION: 0 refills | Status: AC

## 2016-09-22 ENCOUNTER — Ambulatory Visit: Payer: Self-pay | Admitting: Family Medicine

## 2016-10-06 NOTE — Telephone Encounter (Signed)
Patient passed awy this evening around 8PM. EMS called with notification. They will have death certificate sent.  I told them to tell his wife [name] I would call her tomorrow.    [name], MD

## 2016-10-06 DEATH — deceased
